# Patient Record
Sex: Female | Born: 1950
Health system: Southern US, Community
[De-identification: ages and names within clinical notes are randomized; demographics above are authoritative.]

## PROBLEM LIST (undated history)

## (undated) DIAGNOSIS — R32 Unspecified urinary incontinence: Secondary | ICD-10-CM

## (undated) DIAGNOSIS — C50919 Malignant neoplasm of unspecified site of unspecified female breast: Secondary | ICD-10-CM

## (undated) DIAGNOSIS — Z923 Personal history of irradiation: Secondary | ICD-10-CM

## (undated) DIAGNOSIS — D649 Anemia, unspecified: Secondary | ICD-10-CM

## (undated) HISTORY — DX: Anemia, unspecified: D64.9

## (undated) HISTORY — DX: Unspecified urinary incontinence: R32

## (undated) HISTORY — DX: Malignant neoplasm of unspecified site of unspecified female breast: C50.919

---

## 1998-08-02 ENCOUNTER — Ambulatory Visit (HOSPITAL_COMMUNITY): Admission: RE | Admit: 1998-08-02 | Discharge: 1998-08-02 | Payer: Self-pay | Admitting: *Deleted

## 1998-08-02 ENCOUNTER — Encounter: Payer: Self-pay | Admitting: *Deleted

## 1999-03-05 ENCOUNTER — Other Ambulatory Visit: Admission: RE | Admit: 1999-03-05 | Discharge: 1999-03-05 | Payer: Self-pay | Admitting: *Deleted

## 1999-09-17 ENCOUNTER — Ambulatory Visit (HOSPITAL_COMMUNITY): Admission: RE | Admit: 1999-09-17 | Discharge: 1999-09-17 | Payer: Self-pay | Admitting: *Deleted

## 1999-09-17 ENCOUNTER — Encounter: Payer: Self-pay | Admitting: *Deleted

## 2000-04-01 ENCOUNTER — Other Ambulatory Visit: Admission: RE | Admit: 2000-04-01 | Discharge: 2000-04-01 | Payer: Self-pay | Admitting: *Deleted

## 2000-09-20 ENCOUNTER — Ambulatory Visit (HOSPITAL_COMMUNITY): Admission: RE | Admit: 2000-09-20 | Discharge: 2000-09-20 | Payer: Self-pay | Admitting: *Deleted

## 2000-09-20 ENCOUNTER — Encounter: Payer: Self-pay | Admitting: *Deleted

## 2001-05-06 ENCOUNTER — Other Ambulatory Visit: Admission: RE | Admit: 2001-05-06 | Discharge: 2001-05-06 | Payer: Self-pay | Admitting: *Deleted

## 2001-11-29 ENCOUNTER — Encounter: Payer: Self-pay | Admitting: *Deleted

## 2001-11-29 ENCOUNTER — Ambulatory Visit (HOSPITAL_COMMUNITY): Admission: RE | Admit: 2001-11-29 | Discharge: 2001-11-29 | Payer: Self-pay | Admitting: *Deleted

## 2002-05-22 ENCOUNTER — Other Ambulatory Visit: Admission: RE | Admit: 2002-05-22 | Discharge: 2002-05-22 | Payer: Self-pay | Admitting: *Deleted

## 2002-12-05 ENCOUNTER — Encounter: Payer: Self-pay | Admitting: *Deleted

## 2002-12-05 ENCOUNTER — Ambulatory Visit (HOSPITAL_COMMUNITY): Admission: RE | Admit: 2002-12-05 | Discharge: 2002-12-05 | Payer: Self-pay | Admitting: *Deleted

## 2003-05-30 ENCOUNTER — Other Ambulatory Visit: Admission: RE | Admit: 2003-05-30 | Discharge: 2003-05-30 | Payer: Self-pay | Admitting: *Deleted

## 2004-06-04 ENCOUNTER — Other Ambulatory Visit: Admission: RE | Admit: 2004-06-04 | Discharge: 2004-06-04 | Payer: Self-pay | Admitting: *Deleted

## 2004-06-18 ENCOUNTER — Encounter: Admission: RE | Admit: 2004-06-18 | Discharge: 2004-06-18 | Payer: Self-pay | Admitting: *Deleted

## 2004-09-05 ENCOUNTER — Ambulatory Visit: Payer: Self-pay | Admitting: Internal Medicine

## 2005-06-17 ENCOUNTER — Other Ambulatory Visit: Admission: RE | Admit: 2005-06-17 | Discharge: 2005-06-17 | Payer: Self-pay | Admitting: *Deleted

## 2005-09-29 ENCOUNTER — Ambulatory Visit (HOSPITAL_COMMUNITY): Admission: RE | Admit: 2005-09-29 | Discharge: 2005-09-29 | Payer: Self-pay | Admitting: Obstetrics and Gynecology

## 2005-10-19 ENCOUNTER — Encounter: Admission: RE | Admit: 2005-10-19 | Discharge: 2005-10-19 | Payer: Self-pay | Admitting: Obstetrics and Gynecology

## 2005-10-19 ENCOUNTER — Encounter: Admission: RE | Admit: 2005-10-19 | Discharge: 2005-10-19 | Payer: Self-pay | Admitting: Diagnostic Radiology

## 2006-04-22 ENCOUNTER — Encounter: Admission: RE | Admit: 2006-04-22 | Discharge: 2006-04-22 | Payer: Self-pay | Admitting: Obstetrics and Gynecology

## 2006-12-03 ENCOUNTER — Other Ambulatory Visit: Admission: RE | Admit: 2006-12-03 | Discharge: 2006-12-03 | Payer: Self-pay | Admitting: Obstetrics & Gynecology

## 2008-03-20 ENCOUNTER — Other Ambulatory Visit: Admission: RE | Admit: 2008-03-20 | Discharge: 2008-03-20 | Payer: Self-pay | Admitting: Obstetrics & Gynecology

## 2008-04-12 ENCOUNTER — Encounter: Admission: RE | Admit: 2008-04-12 | Discharge: 2008-04-12 | Payer: Self-pay | Admitting: Obstetrics & Gynecology

## 2009-01-10 ENCOUNTER — Ambulatory Visit (HOSPITAL_COMMUNITY): Admission: RE | Admit: 2009-01-10 | Discharge: 2009-01-11 | Payer: Self-pay | Admitting: Neurological Surgery

## 2009-01-10 HISTORY — PX: ANTERIOR CERVICAL DECOMP/DISCECTOMY FUSION: SHX1161

## 2009-05-09 ENCOUNTER — Ambulatory Visit (HOSPITAL_COMMUNITY): Admission: RE | Admit: 2009-05-09 | Discharge: 2009-05-09 | Payer: Self-pay | Admitting: Obstetrics & Gynecology

## 2010-05-15 ENCOUNTER — Ambulatory Visit (HOSPITAL_COMMUNITY): Admission: RE | Admit: 2010-05-15 | Discharge: 2010-05-15 | Payer: Self-pay | Admitting: Family Medicine

## 2011-01-14 LAB — CK TOTAL AND CKMB (NOT AT ARMC): Relative Index: 1.4 (ref 0.0–2.5)

## 2011-01-14 LAB — CBC
Hemoglobin: 12.8 g/dL (ref 12.0–15.0)
MCV: 83.2 fL (ref 78.0–100.0)
Platelets: 250 10*3/uL (ref 150–400)
RDW: 12.9 % (ref 11.5–15.5)

## 2011-02-17 NOTE — Op Note (Signed)
[name], [name]               ACCOUNT NO.:  [id number]   MEDICAL RECORD NO.:  [id number]          PATIENT TYPE:  INP   LOCATION:  3526                         FACILITY:  [facility]   PHYSICIAN:  Stefani Dama, M.D.  DATE OF BIRTH:  [birth year]   DATE OF PROCEDURE:  01/10/2009  DATE OF DISCHARGE:                               OPERATIVE REPORT   PREOPERATIVE DIAGNOSIS:  Cervical spondylosis with herniated nucleus  pulposus C4-5, C5-6, and C6-7.   POSTOPERATIVE DIAGNOSIS:  Cervical spondylosis with herniated nucleus  pulposus C4-5, C5-6, and C6-7.   PROCEDURE:  Anterior cervical decompression at C4-5, C5-6, and C6-7,  arthrodesis with structural allograft and Alphatec plate fixation, C4 to  C7.   SURGEON:  Stefani Dama, MD   FIRST ASSISTANT:  Danae Orleans. Venetia Maxon, MD   ANESTHESIA:  General endotracheal.   INDICATIONS:  Amber Santos is a 60 year old individual who has had  significant problems with neck, shoulder, and bilateral arm pain and  mostly left-sided cervical radiculopathy.  She has had symptoms of pain  in the upper extremities on the right side also.  She was treated with  some epidural steroids in the past and also had been using some  significant opioid medication for control her pain.  Despite this, she  was having significant difficulty with ongoing chronic pain.  She has  been advised regarding surgical decompression C4-5, C5-6, and C6-7.   PROCEDURE:  The patient was brought to the operating room and placed on  table in supine position.  After smooth induction of general  endotracheal anesthesia, she had her neck prepped with alcohol and then  DuraPrep.  A transverse incision was created on the left side of neck  and this was brought down through the platysma.  The plane between  sternocleidomastoid and strap muscle was dissected bluntly until the  prevertebral space was reached.  First identifiable disk space on the  radiograph was noted to be that of C4-C5.  A  self-retaining Caspar  retractor was then placed under longus coli muscle at the level of C4-C5  and dissection was continued by opening the anterior longitudinal  ligament with a #15 blade.  A combination of Kerrison rongeurs was used  to evacuate a significant quantity of severely degenerated and  desiccated disk material.  As the region of posterior longitudinal  ligament was reached, there was noted to be bilobar protrusion of the  disk with some uncinate spur formation in the lateral recesses at C4-C5.  This was drilled away carefully and then the redundant thickened  ligament was removed with 2-mm Kerrison punch, first on the right side  and then on the left side.  Once the central and lateral recess  decompression was obtained, hemostasis in the soft tissues was obtained  meticulously.  The interspace was incised for an appropriate size spacer  and it was felt that a 7-mm Alphatec bone spacer would fit best into the  interspace.  It was drilled to the appropriate size and configuration to  fit within the interspace.  Once this was secured, attention was turned  to  C5-6 where a similar process was carried out.  Here, there was much  more central spondylitic stenosis with thickening and enlargement of the  posterior longitudinal ligament and the inferior margin of the disk  itself.  The overgrowth was drilled away and lateral recesses were  drilled away also.  Once hemostasis was established here, a 7-mm round  spacer was placed into the interspace after being shaved to fit in the  appropriate confines.  This was filled with demineralized bone matrix.  At C6-7, a similar procedure was carried out here.  Here again, there  was mostly bilobed protrusion of the disk with significant spondylosis  that was drilled away.  A 7-mm bone spacer was placed into this area  also.  At this point, the area was inspected and a 54-mm standard size  Alphatec plate was fitted to the ventral aspect of  the vertebral bodies.  This was a Trestle plate and it was secured with eight 14-mm variable  angle screws.  Once this was secured, hemostasis in the soft tissues was  obtained meticulously.  The platysma was brought back into the original  position and closed with 3-0 Vicryl interrupted fashion and 3-0 Vicryl  was used to close the subcuticular skin.  The patient tolerated the  procedure well.  Blood loss was estimated about 50 mL.  She was returned  to recovery room in stable condition.      Stefani Dama, M.D.  Electronically Signed     HJE/MEDQ  D:  01/10/2009  T:  01/11/2009  Job:  [id number]

## 2011-05-15 ENCOUNTER — Other Ambulatory Visit: Payer: Self-pay | Admitting: Obstetrics & Gynecology

## 2011-05-15 DIAGNOSIS — Z1231 Encounter for screening mammogram for malignant neoplasm of breast: Secondary | ICD-10-CM

## 2011-05-21 ENCOUNTER — Ambulatory Visit (HOSPITAL_COMMUNITY)
Admission: RE | Admit: 2011-05-21 | Discharge: 2011-05-21 | Disposition: A | Discharge status: Home / Self Care | Payer: BC Managed Care – PPO | Source: Ambulatory Visit | Attending: Obstetrics & Gynecology | Admitting: Obstetrics & Gynecology

## 2011-05-21 DIAGNOSIS — Z1231 Encounter for screening mammogram for malignant neoplasm of breast: Secondary | ICD-10-CM | POA: Insufficient documentation

## 2011-05-26 ENCOUNTER — Other Ambulatory Visit: Payer: Self-pay | Admitting: Obstetrics & Gynecology

## 2011-05-26 DIAGNOSIS — R928 Other abnormal and inconclusive findings on diagnostic imaging of breast: Secondary | ICD-10-CM

## 2011-05-29 ENCOUNTER — Ambulatory Visit
Admission: RE | Admit: 2011-05-29 | Discharge: 2011-05-29 | Disposition: A | Discharge status: Home / Self Care | Payer: BC Managed Care – PPO | Source: Ambulatory Visit | Attending: Obstetrics & Gynecology | Admitting: Obstetrics & Gynecology

## 2011-05-29 DIAGNOSIS — R928 Other abnormal and inconclusive findings on diagnostic imaging of breast: Secondary | ICD-10-CM

## 2011-06-03 ENCOUNTER — Other Ambulatory Visit: Payer: BC Managed Care – PPO

## 2011-10-06 DIAGNOSIS — Z923 Personal history of irradiation: Secondary | ICD-10-CM

## 2011-10-06 DIAGNOSIS — C50919 Malignant neoplasm of unspecified site of unspecified female breast: Secondary | ICD-10-CM

## 2011-10-06 HISTORY — DX: Personal history of irradiation: Z92.3

## 2011-10-06 HISTORY — DX: Malignant neoplasm of unspecified site of unspecified female breast: C50.919

## 2012-04-25 ENCOUNTER — Other Ambulatory Visit (HOSPITAL_COMMUNITY): Payer: Self-pay | Admitting: Diagnostic Radiology

## 2012-04-25 DIAGNOSIS — Z1231 Encounter for screening mammogram for malignant neoplasm of breast: Secondary | ICD-10-CM

## 2012-05-23 ENCOUNTER — Ambulatory Visit (HOSPITAL_COMMUNITY)
Admission: RE | Admit: 2012-05-23 | Discharge: 2012-05-23 | Disposition: A | Discharge status: Home / Self Care | Payer: BC Managed Care – PPO | Source: Ambulatory Visit | Attending: Diagnostic Radiology | Admitting: Diagnostic Radiology

## 2012-05-23 ENCOUNTER — Other Ambulatory Visit: Payer: Self-pay | Admitting: Obstetrics & Gynecology

## 2012-05-23 DIAGNOSIS — Z1231 Encounter for screening mammogram for malignant neoplasm of breast: Secondary | ICD-10-CM | POA: Insufficient documentation

## 2012-05-25 ENCOUNTER — Other Ambulatory Visit: Payer: Self-pay | Admitting: Obstetrics & Gynecology

## 2012-05-25 DIAGNOSIS — R928 Other abnormal and inconclusive findings on diagnostic imaging of breast: Secondary | ICD-10-CM

## 2012-05-27 ENCOUNTER — Ambulatory Visit
Admission: RE | Admit: 2012-05-27 | Discharge: 2012-05-27 | Disposition: A | Discharge status: Home / Self Care | Payer: BC Managed Care – PPO | Source: Ambulatory Visit | Attending: Obstetrics & Gynecology | Admitting: Obstetrics & Gynecology

## 2012-05-27 ENCOUNTER — Other Ambulatory Visit: Payer: Self-pay | Admitting: Obstetrics & Gynecology

## 2012-05-27 DIAGNOSIS — R928 Other abnormal and inconclusive findings on diagnostic imaging of breast: Secondary | ICD-10-CM

## 2012-05-27 DIAGNOSIS — R921 Mammographic calcification found on diagnostic imaging of breast: Secondary | ICD-10-CM

## 2012-06-01 ENCOUNTER — Ambulatory Visit
Admission: RE | Admit: 2012-06-01 | Discharge: 2012-06-01 | Disposition: A | Discharge status: Home / Self Care | Payer: BC Managed Care – PPO | Source: Ambulatory Visit | Attending: Obstetrics & Gynecology | Admitting: Obstetrics & Gynecology

## 2012-06-01 DIAGNOSIS — R921 Mammographic calcification found on diagnostic imaging of breast: Secondary | ICD-10-CM

## 2012-06-16 ENCOUNTER — Ambulatory Visit (INDEPENDENT_AMBULATORY_CARE_PROVIDER_SITE_OTHER): Payer: BC Managed Care – PPO | Admitting: General Surgery

## 2012-06-16 ENCOUNTER — Other Ambulatory Visit (INDEPENDENT_AMBULATORY_CARE_PROVIDER_SITE_OTHER): Payer: Self-pay | Admitting: General Surgery

## 2012-06-16 ENCOUNTER — Encounter (HOSPITAL_BASED_OUTPATIENT_CLINIC_OR_DEPARTMENT_OTHER): Payer: Self-pay | Admitting: *Deleted

## 2012-06-16 ENCOUNTER — Encounter (INDEPENDENT_AMBULATORY_CARE_PROVIDER_SITE_OTHER): Payer: Self-pay | Admitting: General Surgery

## 2012-06-16 ENCOUNTER — Telehealth (INDEPENDENT_AMBULATORY_CARE_PROVIDER_SITE_OTHER): Payer: Self-pay | Admitting: General Surgery

## 2012-06-16 VITALS — BP 130/88 | HR 74 | Temp 97.3°F | Resp 16 | Ht 68.0 in | Wt 200.1 lb

## 2012-06-16 DIAGNOSIS — N6099 Unspecified benign mammary dysplasia of unspecified breast: Secondary | ICD-10-CM

## 2012-06-16 DIAGNOSIS — N6089 Other benign mammary dysplasias of unspecified breast: Secondary | ICD-10-CM

## 2012-06-16 NOTE — H&P (Signed)
Amber Santos    MRN: [id number]   Description: 61 year old female  Provider: [name], MD  Department: [facility]       Diagnoses     Atypical ductal hyperplasia, breast   - Primary    610.8         Vitals -   BP Pulse Temp Resp Ht Wt    130/88 74 97.3 F (36.3 C) (Temporal) 16 5\' 8"  (1.727 m) 200 lb 2 oz (90.776 kg)     BMI - 30.43 kg/m2                 History and Physical     [name], MD  Patient ID: Amber Santos, female   DOB: [birth year], 61 y.o.   MRN: [id number]                HPI Amber Santos is a 61 y.o. female.  She is referred by Dr. Cain Saupe for evaluation and management of a focal area of atypical ductal hyperplasia of the left breast, upper outer quadrant. Her primary care physician is Dr. Annamaria Boots.   This patient has a family history for breast cancer. She is a member of the [employer]. She has never had a breast problem before.   Recent screening mammogram showed a focal area of microcalcifications in the upper outer quadrant of the left breast posteriorly. Image guided biopsy of this area shows atypical ductal hyperplasia. Dr. Adolphus Birchwood comments that this may turn out to be a low-grade DCIS. The patient has been given a copy of her pathology report.   Family history is significant for breast cancer in her mother, maternal aunt, and paternal first cousin. One aunt had pancreatic cancer. Other members of the family had colon cancer. She is otherwise healthy he has had a C-section and cervical disc surgery. 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      Past Medical History   Diagnosis  Date   .  Anemia         Past Surgical History   Procedure  Date   .  Cesarean section  1986, 1987   .  Neck surgery  2010       Family History   Problem  Relation  Age of Onset   .  Cancer  Mother         breast   .  Cancer  Maternal Aunt         breast, pancreatic     .  Cancer  Maternal Uncle         colon      Social History History   Substance Use Topics   .  Smoking status:  Former Smoker       Quit date:  06/17/1984   .  Smokeless tobacco:  Never Used   .  Alcohol Use:  Yes         weekends, events      No Known Allergies    No current outpatient prescriptions on file.      Review of Systems  Constitutional: Negative for fever, chills and unexpected weight change.  HENT: Negative for hearing loss, congestion, sore throat, trouble swallowing and voice change.   Eyes: Negative for visual disturbance.  Respiratory: Negative for cough and wheezing.   Cardiovascular: Negative for chest pain, palpitations and leg swelling.  Gastrointestinal: Negative for nausea, vomiting, abdominal pain, diarrhea,  constipation, blood in stool, abdominal distention and anal bleeding.  Genitourinary: Negative for hematuria, vaginal bleeding and difficulty urinating.  Musculoskeletal: Negative for arthralgias.  Skin: Negative for rash and wound.  Neurological: Negative for seizures, syncope and headaches.  Hematological: Negative for adenopathy. Does not bruise/bleed easily.  Psychiatric/Behavioral: Negative for confusion.    Blood pressure 130/88, pulse 74, temperature 97.3 F (36.3 C), temperature source Temporal, resp. rate 16, height 5\' 8"  (1.727 m), weight 200 lb 2 oz (90.776 kg).   Physical Exam   Constitutional: She is oriented to person, place, and time. She appears well-developed and well-nourished. No distress.  HENT:   Head: Normocephalic and atraumatic.   Nose: Nose normal.   Mouth/Throat: No oropharyngeal exudate.  Eyes: Conjunctivae normal and EOM are normal. Pupils are equal, round, and reactive to light. Left eye exhibits no discharge. No scleral icterus.  Neck: Neck supple. No JVD present. No tracheal deviation present. No thyromegaly present.  Cardiovascular: Normal rate, regular rhythm, normal heart sounds and intact distal  pulses.    No murmur heard. Pulmonary/Chest: Effort normal and breath sounds normal. No respiratory distress. She has no wheezes. She has no rales. She exhibits no tenderness.       Breasts are large. No palpable mass bilaterally. Biopsy site left breast upper outer quadrant. No ecchymoses. No hematoma. No adenopathy.  Abdominal: Soft. Bowel sounds are normal. She exhibits no distension and no mass. There is no tenderness. There is no rebound and no guarding.  Musculoskeletal: She exhibits no edema and no tenderness.  Lymphadenopathy:    She has no cervical adenopathy.  Neurological: She is alert and oriented to person, place, and time. She exhibits normal muscle tone. Coordination normal.  Skin: Skin is warm. No rash noted. She is not diaphoretic. No erythema. No pallor.  Psychiatric: She has a normal mood and affect. Her behavior is normal. Judgment and thought content normal.    Data Reviewed Mammograms, ultrasound, pathology report.   Assessment Atypical ductal hyperplasia left breast, upper outer quadrant. Small focal area. Excision is indicated to exclude occult noninvasive cancer   Plan Will schedule for left partial mastectomy with margins and needle localization in the near future.   I explained to her and her husband I would like to take a small margin because of the possibility of DCIS in hopes of avoiding a second operation.   We talked about referral to high risk medical oncologist to discuss strategies for risk reduction  assuming this turns out to be ADH. We talked about the need for further treatment if this turns out to be DCIS.   We discussed the indications, details, techniques, and numerous risks of the surgery. We talked about a second operation if there is a positive margin and how we control for that. She understands these issues. Her questions were answered. She agrees with this plan.     [name]. [name], M.D., [facility] General and Minimally invasive Surgery Breast and Colorectal Surgery Office:   [phone] Pager:   [phone]

## 2012-06-16 NOTE — Telephone Encounter (Signed)
Called and left message for patient on cell and home number advising of appointment set for post op on 06/29/12 at 5:15. Patient requested evening/late appointment. Advised for patient to return the call to confirm message was received.

## 2012-06-16 NOTE — Patient Instructions (Signed)
Your recent image guided biopsy of the left breast, upper outer quadrant shows atypical ductal hyperplasia. This area should be excised completely with a margin of normal tissue to be sure there is not early breast cancer newly scheduled for that surgery in the near future.    Lumpectomy, Breast Conserving Surgery A lumpectomy is breast surgery that removes only part of the breast. Another name used may be partial mastectomy. The amount removed varies. Make sure you understand how much of your breast will be removed. Reasons for a lumpectomy:  Any solid breast mass.   Grouped significant nodularity that may be confused with a solitary breast mass.  Lumpectomy is the most common form of breast cancer surgery today. The surgeon removes the portion of your breast which contains the tumor (cancer). This is the lump. Some normal tissue around the lump is also removed to be sure that all the tumor has been removed.  If cancer cells are found in the margins where the breast tissue was removed, your surgeon will do more surgery to remove the remaining cancer tissue. This is called re-excision surgery. Radiation and/or chemotherapy treatments are often given following a lumpectomy to kill any cancer cells that could possibly remain.  REASONS YOU MAY NOT BE ABLE TO HAVE BREAST CONSERVING SURGERY:  The tumor is located in more than one place.   Your breast is small and the tumor is large so the breast would be disfigured.   The entire tumor removal is not successful with a lumpectomy.   You cannot commit to a full course of chemotherapy, radiation therapy or are pregnant and cannot have radiation.   You have previously had radiation to the breast to treat cancer.  HOW A LUMPECTOMY IS PERFORMED If overnight nursing is not required following a biopsy, a lumpectomy can be performed as a same-day surgery. This can be done in a hospital, clinic, or surgical center. The anesthesia used will depend on your  surgeon. They will discuss this with you. A general anesthetic keeps you sleeping through the procedure. LET YOUR CAREGIVERS KNOW ABOUT THE FOLLOWING:  Allergies   Medications taken including herbs, eye drops, over the counter medications, and creams.   Use of steroids (by mouth or creams)   Previous problems with anesthetics or Novocaine.   Possibility of pregnancy, if this applies   History of blood clots (thrombophlebitis)   History of bleeding or blood problems.   Previous surgery   Other health problems  BEFORE THE PROCEDURE You should be present one hour prior to your procedure unless directed otherwise.  AFTER THE PROCEDURE  After surgery, you will be taken to the recovery area where a nurse will watch and check your progress. Once you're awake, stable, and taking fluids well, barring other problems you will be allowed to go home.   Ice packs applied to your operative site may help with discomfort and keep the swelling down.   A small rubber drain may be placed in the breast for a couple of days to prevent a hematoma from developing in the breast.   A pressure dressing may be applied for 24 to 48 hours to prevent bleeding.   Keep the wound dry.   You may resume a normal diet and activities as directed. Avoid strenuous activities affecting the arm on the side of the biopsy site such as tennis, swimming, heavy lifting (more than 10 pounds) or pulling.   Bruising in the breast is normal following this procedure.  Wearing a bra - even to bed - may be more comfortable and also help keep the dressing on.   Change dressings as directed.   Only take over-the-counter or prescription medicines for pain, discomfort, or fever as directed by your caregiver.  Call for your results as instructed by your surgeon. Remember it is your responsibility to get the results of your lumpectomy if your surgeon asked you to follow-up. Do not assume everything is fine if you have not heard  from your caregiver. SEEK MEDICAL CARE IF:   There is increased bleeding (more than a small spot) from the wound.   You notice redness, swelling, or increasing pain in the wound.   Pus is coming from wound.   An unexplained oral temperature above 102 F (38.9 C) develops.   You notice a foul smell coming from the wound or dressing.  SEEK IMMEDIATE MEDICAL CARE IF:   You develop a rash.   You have difficulty breathing.   You have any allergic problems.  Document Released: 11/02/2006 Document Revised: 09/10/2011 Document Reviewed: 02/03/2007 [facility] Patient Information 2012 [place], [place].

## 2012-06-16 NOTE — Progress Notes (Signed)
Patient ID: Amber Santos, female   DOB: 17-Dec-1950, 61 y.o.   MRN: [id number]  Chief Complaint  Patient presents with  . New Evaluation    Ductal hyperplasia     HPI Amber Santos is a 61 y.o. female.  She is referred by Dr. Cain Saupe for evaluation and management of a focal area of atypical ductal hyperplasia of the left breast, upper outer quadrant. Her primary care physician is Dr. Annamaria Boots.  This patient has a family history for breast cancer. She is a member of the [employer]. She has never had a breast problem before.  Recent screening mammogram showed a focal area of microcalcifications in the upper outer quadrant of the left breast posteriorly. Image guided biopsy of this area shows atypical ductal hyperplasia. Dr. Adolphus Birchwood comments that this may turn out to be a low-grade DCIS. The patient has been given a copy of her pathology report.  Family history is significant for breast cancer in her mother, maternal aunt, and paternal first cousin. One aunt had pancreatic cancer. Other members of the family had colon cancer. She is otherwise healthy he has had a C-section and cervical disc surgery. 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 HPI  Past Medical History  Diagnosis Date  . Anemia     Past Surgical History  Procedure Date  . Cesarean section 1986, 1987  . Neck surgery 2010    Family History  Problem Relation Age of Onset  . Cancer Mother     breast  . Cancer Maternal Aunt     breast, pancreatic  . Cancer Maternal Uncle     colon    Social History History  Substance Use Topics  . Smoking status: Former Smoker    Quit date: 06/17/1984  . Smokeless tobacco: Never Used  . Alcohol Use: Yes     weekends, events    No Known Allergies  No current outpatient prescriptions on file.    Review of Systems Review of Systems  Constitutional: Negative for fever, chills and unexpected weight change.  HENT:  Negative for hearing loss, congestion, sore throat, trouble swallowing and voice change.   Eyes: Negative for visual disturbance.  Respiratory: Negative for cough and wheezing.   Cardiovascular: Negative for chest pain, palpitations and leg swelling.  Gastrointestinal: Negative for nausea, vomiting, abdominal pain, diarrhea, constipation, blood in stool, abdominal distention and anal bleeding.  Genitourinary: Negative for hematuria, vaginal bleeding and difficulty urinating.  Musculoskeletal: Negative for arthralgias.  Skin: Negative for rash and wound.  Neurological: Negative for seizures, syncope and headaches.  Hematological: Negative for adenopathy. Does not bruise/bleed easily.  Psychiatric/Behavioral: Negative for confusion.    Blood pressure 130/88, pulse 74, temperature 97.3 F (36.3 C), temperature source Temporal, resp. rate 16, height 5\' 8"  (1.727 m), weight 200 lb 2 oz (90.776 kg).  Physical Exam Physical Exam  Constitutional: She is oriented to person, place, and time. She appears well-developed and well-nourished. No distress.  HENT:  Head: Normocephalic and atraumatic.  Nose: Nose normal.  Mouth/Throat: No oropharyngeal exudate.  Eyes: Conjunctivae normal and EOM are normal. Pupils are equal, round, and reactive to light. Left eye exhibits no discharge. No scleral icterus.  Neck: Neck supple. No JVD present. No tracheal deviation present. No thyromegaly present.  Cardiovascular: Normal rate, regular rhythm, normal heart sounds and intact distal pulses.   No murmur heard. Pulmonary/Chest: Effort normal and breath sounds normal. No respiratory distress. She has no  wheezes. She has no rales. She exhibits no tenderness.       Breasts are large. No palpable mass bilaterally. Biopsy site left breast upper outer quadrant. No ecchymoses. No hematoma. No adenopathy.  Abdominal: Soft. Bowel sounds are normal. She exhibits no distension and no mass. There is no tenderness. There is  no rebound and no guarding.  Musculoskeletal: She exhibits no edema and no tenderness.  Lymphadenopathy:    She has no cervical adenopathy.  Neurological: She is alert and oriented to person, place, and time. She exhibits normal muscle tone. Coordination normal.  Skin: Skin is warm. No rash noted. She is not diaphoretic. No erythema. No pallor.  Psychiatric: She has a normal mood and affect. Her behavior is normal. Judgment and thought content normal.    Data Reviewed Mammograms, ultrasound, pathology report.  Assessment    Atypical ductal hyperplasia left breast, upper outer quadrant. Small focal area. Excision is indicated to exclude occult noninvasive cancer    Plan    Will schedule for left partial mastectomy with margins and needle localization in the near future.  I explained to her and her husband I would like to take a small margin because of the possibility of DCIS in hopes of avoiding a second operation.  We talked about referral to high risk medical oncologist to discuss strategies for risk reduction  assuming this turns out to be ADH. We talked about the need for further treatment if this turns out to be DCIS.  We discussed the indications, details, techniques, and numerous risks of the surgery. We talked about a second operation if there is a positive margin and how we control for that. She understands these issues. Her questions were answered. She agrees with this plan.       Aiden Helzer M 06/16/2012, 9:16 AM

## 2012-06-16 NOTE — Progress Notes (Signed)
Pt here from [facility] office-she is nervous getting this done so soon, but ready. Mother br ca survivor. No labs needed

## 2012-06-17 ENCOUNTER — Encounter (HOSPITAL_BASED_OUTPATIENT_CLINIC_OR_DEPARTMENT_OTHER): Payer: Self-pay | Admitting: Anesthesiology

## 2012-06-17 ENCOUNTER — Ambulatory Visit (HOSPITAL_BASED_OUTPATIENT_CLINIC_OR_DEPARTMENT_OTHER): Payer: BC Managed Care – PPO | Admitting: Anesthesiology

## 2012-06-17 ENCOUNTER — Encounter (HOSPITAL_BASED_OUTPATIENT_CLINIC_OR_DEPARTMENT_OTHER)
Admission: RE | Disposition: A | Discharge status: Home / Self Care | Payer: Self-pay | Source: Ambulatory Visit | Attending: General Surgery

## 2012-06-17 ENCOUNTER — Ambulatory Visit
Admission: RE | Admit: 2012-06-17 | Discharge: 2012-06-17 | Disposition: A | Discharge status: Home / Self Care | Payer: BC Managed Care – PPO | Source: Ambulatory Visit | Attending: General Surgery | Admitting: General Surgery

## 2012-06-17 ENCOUNTER — Ambulatory Visit (HOSPITAL_BASED_OUTPATIENT_CLINIC_OR_DEPARTMENT_OTHER)
Admission: RE | Admit: 2012-06-17 | Discharge: 2012-06-17 | Disposition: A | Discharge status: Home / Self Care | Payer: BC Managed Care – PPO | Source: Ambulatory Visit | Attending: General Surgery | Admitting: General Surgery

## 2012-06-17 ENCOUNTER — Encounter (HOSPITAL_BASED_OUTPATIENT_CLINIC_OR_DEPARTMENT_OTHER): Payer: Self-pay | Admitting: *Deleted

## 2012-06-17 DIAGNOSIS — D0590 Unspecified type of carcinoma in situ of unspecified breast: Secondary | ICD-10-CM | POA: Insufficient documentation

## 2012-06-17 DIAGNOSIS — N6099 Unspecified benign mammary dysplasia of unspecified breast: Secondary | ICD-10-CM

## 2012-06-17 DIAGNOSIS — N6089 Other benign mammary dysplasias of unspecified breast: Secondary | ICD-10-CM | POA: Insufficient documentation

## 2012-06-17 DIAGNOSIS — C50419 Malignant neoplasm of upper-outer quadrant of unspecified female breast: Secondary | ICD-10-CM | POA: Insufficient documentation

## 2012-06-17 DIAGNOSIS — Z803 Family history of malignant neoplasm of breast: Secondary | ICD-10-CM | POA: Insufficient documentation

## 2012-06-17 HISTORY — PX: BREAST LUMPECTOMY: SHX2

## 2012-06-17 SURGERY — PARTIAL MASTECTOMY WITH NEEDLE LOCALIZATION
Anesthesia: General | Site: Breast | Laterality: Left | Wound class: Clean

## 2012-06-17 MED ORDER — HYDROCODONE-ACETAMINOPHEN 5-325 MG PO TABS: 1.0000 | ORAL_TABLET | ORAL | Status: AC | PRN

## 2012-06-17 MED ORDER — ACETAMINOPHEN 10 MG/ML IV SOLN
1000.0000 mg | Freq: Once | INTRAVENOUS | Status: AC
  Administered 2012-06-17: 1000 mg via INTRAVENOUS

## 2012-06-17 MED ORDER — PROPOFOL 10 MG/ML IV BOLUS
INTRAVENOUS | Status: DC | PRN
  Administered 2012-06-17: 200 mg via INTRAVENOUS

## 2012-06-17 MED ORDER — HEPARIN SODIUM (PORCINE) 5000 UNIT/ML IJ SOLN
5000.0000 [IU] | Freq: Once | INTRAMUSCULAR | Status: AC
  Administered 2012-06-17: 5000 [IU] via SUBCUTANEOUS

## 2012-06-17 MED ORDER — FENTANYL CITRATE 0.05 MG/ML IJ SOLN
INTRAMUSCULAR | Status: DC | PRN
  Administered 2012-06-17 (×2): 25 ug via INTRAVENOUS
  Administered 2012-06-17: 50 ug via INTRAVENOUS
  Administered 2012-06-17: 25 ug via INTRAVENOUS

## 2012-06-17 MED ORDER — MIDAZOLAM HCL 5 MG/5ML IJ SOLN: INTRAMUSCULAR | Status: DC | PRN

## 2012-06-17 MED ORDER — EPHEDRINE SULFATE 50 MG/ML IJ SOLN
INTRAMUSCULAR | Status: DC | PRN
  Administered 2012-06-17: 15 mg via INTRAVENOUS

## 2012-06-17 MED ORDER — CHLORHEXIDINE GLUCONATE 4 % EX LIQD: 1.0000 "application " | Freq: Once | CUTANEOUS | Status: DC

## 2012-06-17 MED ORDER — LACTATED RINGERS IV SOLN
INTRAVENOUS | Status: DC
  Administered 2012-06-17: 14:00:00 via INTRAVENOUS

## 2012-06-17 MED ORDER — BUPIVACAINE-EPINEPHRINE 0.5% -1:200000 IJ SOLN
INTRAMUSCULAR | Status: DC | PRN
  Administered 2012-06-17: 10 mL

## 2012-06-17 MED ORDER — MIDAZOLAM HCL 5 MG/5ML IJ SOLN
INTRAMUSCULAR | Status: DC | PRN
  Administered 2012-06-17: 2 mg via INTRAVENOUS
  Administered 2012-06-17: 0.5 mg via INTRAVENOUS

## 2012-06-17 MED ORDER — OXYCODONE HCL 5 MG PO TABS
5.0000 mg | ORAL_TABLET | Freq: Once | ORAL | Status: AC | PRN
  Administered 2012-06-17: 5 mg via ORAL

## 2012-06-17 MED ORDER — CEFAZOLIN SODIUM-DEXTROSE 2-3 GM-% IV SOLR
2.0000 g | INTRAVENOUS | Status: AC
  Administered 2012-06-17: 2 g via INTRAVENOUS

## 2012-06-17 MED ORDER — ONDANSETRON HCL 4 MG/2ML IJ SOLN
INTRAMUSCULAR | Status: DC | PRN
  Administered 2012-06-17: 4 mg via INTRAVENOUS

## 2012-06-17 MED ORDER — SCOPOLAMINE 1 MG/3DAYS TD PT72
1.0000 | MEDICATED_PATCH | Freq: Once | TRANSDERMAL | Status: DC
  Administered 2012-06-17: 1.5 mg via TRANSDERMAL

## 2012-06-17 MED ORDER — LIDOCAINE HCL (CARDIAC) 20 MG/ML IV SOLN
INTRAVENOUS | Status: DC | PRN
  Administered 2012-06-17: 50 mg via INTRAVENOUS

## 2012-06-17 MED ORDER — OXYCODONE HCL 5 MG/5ML PO SOLN: 5.0000 mg | Freq: Once | ORAL | Status: AC | PRN

## 2012-06-17 MED ORDER — HYDROMORPHONE HCL PF 1 MG/ML IJ SOLN
0.2500 mg | INTRAMUSCULAR | Status: DC | PRN
  Administered 2012-06-17 (×2): 0.5 mg via INTRAVENOUS

## 2012-06-17 MED ORDER — DEXAMETHASONE SODIUM PHOSPHATE 4 MG/ML IJ SOLN
INTRAMUSCULAR | Status: DC | PRN
  Administered 2012-06-17: 10 mg via INTRAVENOUS

## 2012-06-17 SURGICAL SUPPLY — 59 items
ADH SKN CLS APL DERMABOND .7 (GAUZE/BANDAGES/DRESSINGS) ×2
APL SKNCLS STERI-STRIP NONHPOA (GAUZE/BANDAGES/DRESSINGS)
APPLIER CLIP 9.375 MED OPEN (MISCELLANEOUS)
APR CLP MED 9.3 20 MLT OPN (MISCELLANEOUS)
BANDAGE ELASTIC 6 VELCRO ST LF (GAUZE/BANDAGES/DRESSINGS) IMPLANT
BENZOIN TINCTURE PRP APPL 2/3 (GAUZE/BANDAGES/DRESSINGS) IMPLANT
BINDER BREAST XLRG (GAUZE/BANDAGES/DRESSINGS) ×1 IMPLANT
BLADE HEX COATED 2.75 (ELECTRODE) ×2 IMPLANT
BLADE SURG 15 STRL LF DISP TIS (BLADE) ×2 IMPLANT
BLADE SURG 15 STRL SS (BLADE) ×2
CANISTER SUCTION 1200CC (MISCELLANEOUS) ×2 IMPLANT
CHLORAPREP W/TINT 26ML (MISCELLANEOUS) ×2 IMPLANT
CLIP APPLIE 9.375 MED OPEN (MISCELLANEOUS) IMPLANT
CLOTH BEACON ORANGE TIMEOUT ST (SAFETY) ×2 IMPLANT
COVER MAYO STAND STRL (DRAPES) ×2 IMPLANT
COVER TABLE BACK 60X90 (DRAPES) ×2 IMPLANT
DECANTER SPIKE VIAL GLASS SM (MISCELLANEOUS) IMPLANT
DERMABOND ADVANCED (GAUZE/BANDAGES/DRESSINGS) ×2
DERMABOND ADVANCED .7 DNX12 (GAUZE/BANDAGES/DRESSINGS) IMPLANT
DEVICE DUBIN W/COMP PLATE 8390 (MISCELLANEOUS) ×1 IMPLANT
DRAPE LAPAROSCOPIC ABDOMINAL (DRAPES) IMPLANT
DRAPE LAPAROTOMY TRNSV 102X78 (DRAPE) IMPLANT
DRAPE PED LAPAROTOMY (DRAPES) ×2 IMPLANT
DRAPE UTILITY XL STRL (DRAPES) ×2 IMPLANT
ELECT REM PT RETURN 9FT ADLT (ELECTROSURGICAL) ×2
ELECTRODE REM PT RTRN 9FT ADLT (ELECTROSURGICAL) ×1 IMPLANT
GAUZE SPONGE 4X4 12PLY STRL LF (GAUZE/BANDAGES/DRESSINGS) IMPLANT
GAUZE SPONGE 4X4 16PLY XRAY LF (GAUZE/BANDAGES/DRESSINGS) IMPLANT
GLOVE ECLIPSE 6.5 STRL STRAW (GLOVE) ×1 IMPLANT
GLOVE EUDERMIC 7 POWDERFREE (GLOVE) ×2 IMPLANT
GOWN PREVENTION PLUS XLARGE (GOWN DISPOSABLE) ×2 IMPLANT
GOWN PREVENTION PLUS XXLARGE (GOWN DISPOSABLE) ×2 IMPLANT
KIT MARKER MARGIN INK (KITS) ×1 IMPLANT
NDL HYPO 25X1 1.5 SAFETY (NEEDLE) ×1 IMPLANT
NEEDLE HYPO 22GX1.5 SAFETY (NEEDLE) IMPLANT
NEEDLE HYPO 25X1 1.5 SAFETY (NEEDLE) ×2 IMPLANT
NS IRRIG 1000ML POUR BTL (IV SOLUTION) ×2 IMPLANT
PACK BASIN DAY SURGERY FS (CUSTOM PROCEDURE TRAY) ×2 IMPLANT
PENCIL BUTTON HOLSTER BLD 10FT (ELECTRODE) ×2 IMPLANT
SLEEVE SCD COMPRESS KNEE MED (MISCELLANEOUS) ×1 IMPLANT
SPONGE LAP 4X18 X RAY DECT (DISPOSABLE) ×2 IMPLANT
STRIP CLOSURE SKIN 1/2X4 (GAUZE/BANDAGES/DRESSINGS) IMPLANT
SUT ETHILON 4 0 PS 2 18 (SUTURE) IMPLANT
SUT MON AB 4-0 PC3 18 (SUTURE) ×1 IMPLANT
SUT SILK 2 0 SH (SUTURE) ×2 IMPLANT
SUT VIC AB 2-0 SH 27 (SUTURE)
SUT VIC AB 2-0 SH 27XBRD (SUTURE) IMPLANT
SUT VIC AB 3-0 FS2 27 (SUTURE) IMPLANT
SUT VIC AB 4-0 P-3 18XBRD (SUTURE) IMPLANT
SUT VIC AB 4-0 P3 18 (SUTURE)
SUT VICRYL 3-0 CR8 SH (SUTURE) ×2 IMPLANT
SUT VICRYL 4-0 PS2 18IN ABS (SUTURE) IMPLANT
SYR BULB 3OZ (MISCELLANEOUS) IMPLANT
SYR CONTROL 10ML LL (SYRINGE) ×2 IMPLANT
TAPE HYPAFIX 4 X10 (GAUZE/BANDAGES/DRESSINGS) IMPLANT
TOWEL OR NON WOVEN STRL DISP B (DISPOSABLE) ×2 IMPLANT
TUBE CONNECTING 20X1/4 (TUBING) ×2 IMPLANT
WATER STERILE IRR 1000ML POUR (IV SOLUTION) ×2 IMPLANT
YANKAUER SUCT BULB TIP NO VENT (SUCTIONS) ×2 IMPLANT

## 2012-06-17 NOTE — Anesthesia Preprocedure Evaluation (Addendum)
Anesthesia Evaluation  Patient identified by MRN, date of birth, ID band Patient awake    Reviewed: Allergy & Precautions, H&P , NPO status , Patient's Chart, lab work & pertinent test results  Airway Mallampati: II TM Distance: >3 FB Neck ROM: Full    Dental No notable dental hx. (+) Teeth Intact and Dental Advisory Given   Pulmonary neg pulmonary ROS,  breath sounds clear to auscultation  Pulmonary exam normal       Cardiovascular negative cardio ROS  Rhythm:Regular Rate:Normal     Neuro/Psych negative neurological ROS  negative psych ROS   GI/Hepatic negative GI ROS, Neg liver ROS,   Endo/Other  negative endocrine ROS  Renal/GU negative Renal ROS  negative genitourinary   Musculoskeletal   Abdominal   Peds  Hematology negative hematology ROS (+)   Anesthesia Other Findings   Reproductive/Obstetrics negative OB ROS                           Anesthesia Physical Anesthesia Plan  ASA: II  Anesthesia Plan: General   Post-op Pain Management:    Induction: Intravenous  Airway Management Planned: LMA  Additional Equipment:   Intra-op Plan:   Post-operative Plan: Extubation in OR  Informed Consent: I have reviewed the patients History and Physical, chart, labs and discussed the procedure including the risks, benefits and alternatives for the proposed anesthesia with the patient or authorized representative who has indicated his/her understanding and acceptance.   Dental advisory given  Plan Discussed with: CRNA  Anesthesia Plan Comments:         Anesthesia Quick Evaluation  

## 2012-06-17 NOTE — Transfer of Care (Signed)
Immediate Anesthesia Transfer of Care Note  Patient: Amber Santos  Procedure(s) Performed: Procedure(s) (LRB) with comments: PARTIAL MASTECTOMY WITH NEEDLE LOCALIZATION (Left) - left partial mastectomy with needle localization  Patient Location: PACU  Anesthesia Type: General  Level of Consciousness: awake  Airway & Oxygen Therapy: Patient Spontanous Breathing and Patient connected to face mask oxygen  Post-op Assessment: Report given to PACU RN and Post -op Vital signs reviewed and stable  Post vital signs: Reviewed and stable  Complications: No apparent anesthesia complications

## 2012-06-17 NOTE — Interval H&P Note (Signed)
History and Physical Interval Note:  06/17/2012 2:13 PM  Amber Santos  has presented today for surgery, with the diagnosis of atypical ductal hyperplasia  The goals and the  various methods of treatment have been discussed with the patient and family. After consideration of risks, benefits and other options for treatment, the patient has consented to  Procedure(s) (LRB) with comments: PARTIAL MASTECTOMY WITH NEEDLE LOCALIZATION (Left) - left partial mastectomy with needle localization as a surgical intervention .  The patient's history has been reviewed, patient examined, no change in status, stable for surgery.  I have reviewed the patient's chart and labs.  Questions were answered to the patient's satisfaction.     [name]

## 2012-06-17 NOTE — Anesthesia Procedure Notes (Signed)
Procedure Name: LMA Insertion Performed by: York Grice Pre-anesthesia Checklist: Patient identified, Timeout performed, Emergency Drugs available, Suction available and Patient being monitored Patient Re-evaluated:Patient Re-evaluated prior to inductionOxygen Delivery Method: Circle system utilized Preoxygenation: Pre-oxygenation with 100% oxygen Intubation Type: IV induction Ventilation: Mask ventilation without difficulty LMA: LMA with gastric port inserted LMA Size: 4.0 Number of attempts: 2 Placement Confirmation: breath sounds checked- equal and bilateral Tube secured with: Tape Dental Injury: Teeth and Oropharynx as per pre-operative assessment

## 2012-06-17 NOTE — Anesthesia Postprocedure Evaluation (Signed)
  Anesthesia Post-op Note  Patient: Amber Santos  Procedure(s) Performed: Procedure(s) (LRB) with comments: PARTIAL MASTECTOMY WITH NEEDLE LOCALIZATION (Left) - left partial mastectomy with needle localization  Patient Location: PACU  Anesthesia Type: General  Level of Consciousness: awake and alert   Airway and Oxygen Therapy: Patient Spontanous Breathing  Post-op Pain: mild  Post-op Assessment: Post-op Vital signs reviewed, Patient's Cardiovascular Status Stable, Respiratory Function Stable, Patent Airway and No signs of Nausea or vomiting  Post-op Vital Signs: Reviewed and stable  Complications: No apparent anesthesia complications

## 2012-06-17 NOTE — Op Note (Signed)
Patient Name:           Amber Santos   Date of Surgery:        06/17/2012  Pre op Diagnosis:      Atypical ductal hyperplasia, left breast, upper outer quadrant  Post op Diagnosis:    same  Procedure:                 Left partial mastectomy with needle localization  Surgeon:                     [name]. [name], M.D., FACS  Assistant:                      none  Operative Indications:   GENEVER HENTGES is a 61 y.o. female. She was referred by Dr. Cain Saupe for evaluation and management of a focal area of atypical ductal hyperplasia of the left breast, upper outer quadrant. Her primary care physician is Dr. Annamaria Boots. . She has never had a breast problem before.  Recent screening mammogram showed a focal area of microcalcifications in the upper outer quadrant of the left breast posteriorly. Image guided biopsy of this area shows atypical ductal hyperplasia. Dr. Adolphus Birchwood comments that this may turn out to be a low-grade DCIS. The patient has been given a copy of her pathology report.  Family history is significant for breast cancer in her mother, maternal aunt, and paternal first cousin. One aunt had pancreatic cancer. Other members of the family had colon cancer. She is otherwise healthy he has had a C-section and cervical disc surgery. She is a Production designer, theatre/television/film. She is brought to the operating room electively, following counseling in my office.   Operative Findings:       The left breast tissue showed some biopsy changes, and the specimen mammogram showed that the wire and a marker clip were completely contained within the specimen.  Procedure in Detail:          The patient underwent wire localization at the [facility] [place], and the wire was well placed from lateral to medial. The patient was brought to [facility] center and general anesthesia was induced. The left breast was prepped and draped in a sterile fashion. Intravenous antibiotics were given.  Surgical time out was performed. 0.5% Marcaine with epinephrine was used as a local infiltration anesthetic. A curvilinear incision was made laterally in the left breast, paralleling the areolar margin although this was 12-15 cm lateral to the areolar margin. Dissection was carried down into the breast tissue and widely around the localizing wire. We never saw the wire in the deeper  portions of the specimen. The specimen was removed and marked with sutures and with six color ink kit. Specimen mammogram was performed, and it looked good.. Dr. Jean Rosenthal stated that the wire and the clip were within the specimen. The specimen was sent to the pathology lab. Hemostasis was excellent and achieved with electrocautery. The wound irrigated with saline. The breast tissues were closed in several layers with interrupted sutures of 3-0 Vicryl and the skin closed with a running subcuticular suture of 4-0 Monocryl and Dermabond. The patient taken to recovery in stable condition. Estimated blood loss 15 cc. Counts correct. Complications none.     [name]. [name], M.D., FACS General and Minimally Invasive Surgery Breast and Colorectal Surgery  06/17/2012 3:17 PM

## 2012-06-20 LAB — POCT HEMOGLOBIN-HEMACUE: Hemoglobin: 13.2 g/dL (ref 12.0–15.0)

## 2012-06-21 ENCOUNTER — Telehealth (INDEPENDENT_AMBULATORY_CARE_PROVIDER_SITE_OTHER): Payer: Self-pay

## 2012-06-21 ENCOUNTER — Other Ambulatory Visit (INDEPENDENT_AMBULATORY_CARE_PROVIDER_SITE_OTHER): Payer: Self-pay | Admitting: General Surgery

## 2012-06-21 ENCOUNTER — Telehealth (INDEPENDENT_AMBULATORY_CARE_PROVIDER_SITE_OTHER): Payer: Self-pay | Admitting: General Surgery

## 2012-06-21 DIAGNOSIS — C50919 Malignant neoplasm of unspecified site of unspecified female breast: Secondary | ICD-10-CM

## 2012-06-21 NOTE — Addendum Note (Signed)
Addendum  created 06/21/12 0908 by Jewel Baize Derk Doubek, CRNA   Modules edited:Anesthesia Responsible Staff

## 2012-06-21 NOTE — Telephone Encounter (Signed)
Dr. Adolphus Birchwood at [facility] pathology called and told me that the lumpectomy specimen for Amber Santos shows a 1.4 cm area of grade 2 mixed Invasive and non-invasive ductal cancer. There are also 2 other foci of DCIS. Margins are negative. Breast diagnostic profile is pending.  I called Amber Santos and discussed this with her. Because of her family history, dense breasts, and possible multifocal disease we are going to schedule her for bilateral breast MRI.  She has an appointment to see me on September 25.  I called Dr. Darnelle Catalan, who is on the [facility] board with her, and he is going to schedule an appointment for her to see him on October 4.  I told her that we would at least need to do a sentinel node biopsy, but that we did not want to plan any surgery until she had the MRI, saw Dr. Darnelle Catalan, and possibly was referred for genetic counseling.  Pattricia Boss our office is going to arrange the MRI and to confirm the followup appointment with me.   [name]. [name], M.D., [facility] General and Minimally invasive Surgery Breast and Colorectal Surgery Office:   [phone] Pager:   [phone]

## 2012-06-21 NOTE — Telephone Encounter (Signed)
Patient calling in for pathology results from Breast Mastectomy on 06/17/12.  Patient advised that pathology results typically take 3 business day's and we will contact her as soon as the results are available.

## 2012-06-21 NOTE — Addendum Note (Signed)
Addendum  created 06/21/12 0908 by Daquawn Seelman D Pooja Camuso, CRNA   Modules edited:Anesthesia Responsible Staff    

## 2012-06-23 ENCOUNTER — Telehealth: Payer: Self-pay | Admitting: *Deleted

## 2012-06-23 NOTE — Telephone Encounter (Signed)
Patient returned my call & I confirmed 07/08/12 appt w/ pt.  Mailed before appt letter & packet to pt.  Emailed Dana at [facility] to make aware.  Took paperwork to Med Rec for chart.

## 2012-06-26 ENCOUNTER — Ambulatory Visit
Admission: RE | Admit: 2012-06-26 | Discharge: 2012-06-26 | Disposition: A | Discharge status: Home / Self Care | Payer: BC Managed Care – PPO | Source: Ambulatory Visit | Attending: General Surgery | Admitting: General Surgery

## 2012-06-26 DIAGNOSIS — C50919 Malignant neoplasm of unspecified site of unspecified female breast: Secondary | ICD-10-CM

## 2012-06-26 MED ORDER — GADOBENATE DIMEGLUMINE 529 MG/ML IV SOLN
19.0000 mL | Freq: Once | INTRAVENOUS | Status: AC | PRN
  Administered 2012-06-26: 19 mL via INTRAVENOUS

## 2012-06-29 ENCOUNTER — Ambulatory Visit (INDEPENDENT_AMBULATORY_CARE_PROVIDER_SITE_OTHER): Payer: BC Managed Care – PPO | Admitting: General Surgery

## 2012-06-29 ENCOUNTER — Encounter (INDEPENDENT_AMBULATORY_CARE_PROVIDER_SITE_OTHER): Payer: Self-pay | Admitting: General Surgery

## 2012-06-29 VITALS — BP 140/90 | HR 68 | Temp 98.0°F | Resp 18 | Ht 68.0 in | Wt 196.4 lb

## 2012-06-29 DIAGNOSIS — C50419 Malignant neoplasm of upper-outer quadrant of unspecified female breast: Secondary | ICD-10-CM

## 2012-06-29 NOTE — Patient Instructions (Addendum)
Your lumpectomy showed mixed invasive and noninvasive breast cancer. Margins are negative. Hormone receptors are positive. Herceptin is negative.  MRI shows biopsy changes in the left breast, otherwise there is no other abnormality in either breast, and the lymph nodes look negative  You will  likely be presented at breast conference next Wednesday.  You are scheduled to see Dr. Darnelle Catalan on October 4. He will discuss other options for  therapy and will  also discuss the value of genetic counseling and genetic testing.  After our discussion today, we have decided to tentatively schedule you for a left axillary sentinel node biopsy, possible axillary lymph node dissection. We will set that up on a date after you see Dr. Darnelle Catalan in case anything changes.      Sentinel Lymph Node Biopsy Care After Refer to this sheet over the next few weeks. These instructions give you general information on caring for yourself after you leave the hospital. Your doctor may also give you specific instructions. Your treatment has been planned according to the most current medical practices available. However, problems sometimes occur. If you have any problems or questions after discharge, call your caregiver. ACTIVITY  Limit your activity as directed.   Stop vigorous exercise as directed.   Refrain from repetitive motion on the side of surgery as directed.   You may shower 24 hours after your surgery.   It is okay to get the cut from surgery (incision) wet.   Gently pat the incision dry.   You should wear the surgical bra supplied to you (or one of your own) for the next 48 hours. You may remove the bra to shower.  INCISION  The paper tapes or skin adhesive strips should remain in place until your follow-up visit.   It is okay if the tapes fall off. There is no need to replace them if this happens.  DISCOMFORT  It is common to have discomfort (aching or pain) for the first few days after surgery.  This is normal.   Mild anti-inflammatory medicines taken regularly for the next 3 days after surgery are usually able to control any discomfort you may have. Take only as directed.   Report any pain not controlled by medicine to your surgeon or caregiver.  NUTRITION  You may resume your regular diet.   You may wish to eat a light meal for your first meal after surgery.  AFTER SURGERY  The color of your urine may be blue for the next 24 hours. This is due to the blue dye used during surgery. This is normal.   The dye may also cause a blue tint to your breast. This will go away.  SEEK IMMEDIATE MEDICAL CARE IF:   Your pain is not controlled by medicine.   You notice redness, swelling, or increased drainage at the surgery site.   You feel sick to your stomach (nausea) or vomit.  Document Released: 05/05/2004 Document Revised: 09/10/2011 Document Reviewed: 08/17/2011 [facility] Patient Information 2012 [place], [place].Sentinel Lymph Node Analysis in Breast Cancer Treatment WHAT IS A LYMPH NODE? Lymph nodes are little glands that lie along the lymph channels and serve to trap infections in the body. These are the small vessel-like structures that carry the fluid (lymph) from body tissues away to be filtered. These are the "glands" that feel swollen in the neck when you or your child has a sore throat. These glands in your armpit are where breast cancer tends to spread first. WHAT IS SENTINEL LYMPH  NODE ANALYSIS? Sentinel lymph node study is a new cancer diagnostic procedure. It aims to look at the most likely first spread of breast cancer. It involves looking at the lymph node or nodes where breast cancer is likely to travel next.  PROCEDURE  A small amount of blue dye and radioactive tracer are injected around the tumor in the breast. The dye and tracer follow the same path that a spreading cancer would be likely to follow. With the use of a Conservation officer, nature, the radioactive tracer can be  located in the lymph node that is the gatekeeper to other lymph nodes in the armpit. The sentinel lymph node is the first lymph node in a chain of lymph nodes that drain the lymph from the breast. The blue dye enables the surgeon to identify this sentinel node. This node can be removed and examined. If no cancer is found in this node, no further removal of lymph nodes is recommended. In this case, the spread of cancer to the other lymph nodes is rare. This eliminates any more surgery in the armpit and risk of complications. If the lymph node shows spread of the cancer from the breast, the other lymph nodes in the armpit are removed and analyzed. This helps the doctor and patient decide how much more surgery is needed and if chemotherapy and/or radiation treatment is necessary following the surgery.  BENEFITS  The pathology tests for this procedure are much more sensitive than was previously available.   This technique is thought to be a major improvement in the treatment of breast cancer.   This procedure allows many patients to avoid the effects of more extensive underarm lymph node removal and risk of complications (infection, bleeding, or severe arm swelling).   Survival rates from breast cancer are better and the risk of complications isreduced.  Document Released: 09/21/2005 Document Revised: 09/10/2011 Document Reviewed: 06/07/2007 [facility] Patient Information 2012 [place], [place].

## 2012-06-29 NOTE — Progress Notes (Addendum)
Patient ID: Amber Santos, female   DOB: [birth year], 61 y.o.   MRN: [id number]  Chief Complaint  Patient presents with  . Follow-up    part.masty-left    HPI Amber Santos is a 61 y.o. female.  She returns for discussion about management of her left breast cancer.  She was initially evaluated by radiology and underwent image guided biopsy which showed atypical ductal hyperplasia. On September 13  she underwent left partial mastectomy, upper outer quadrant with needle localization. Final pathology report showed invasive ductal carcinoma, 1.4 cm, with associated DCIS. There were one or 2 other small foci nearby which are DCIS. Margins are negative. Hormone receptor strongly positive, HER-2-negative, Ki 67 16%.  She went for MRI which shows surgical changes in the upper outer quadrant of the left breast but otherwise no other areas of enhancement in the breast and nodes looked negative.  Recall that family history is positive for breast cancer in the mother, a maternal aunt and a paternal first cousin.  I have discussed her care with Dr. Darnelle Catalan and  he is going to see her on October 4. We will put her on for breast conference next Wednesday, Oct 2.  She has done fairly well since the surgery. Not much pain. No wound problems.  I spent over an hour discussing her options for therapy and overall care plan with her and her husband.  We talked about the fact that she probably has had an adequate lumpectomy for that she'll need sentinel lymph node biopsy.  We discussed the fact that she might have to have an axillary dissection if the blue dye and and the radioactive tracer do not map to the nodes. We talked about the possibility of axillary dissection if she has 3 or more positive nodes. We also talked about the option of mastectomy and sentinel node biopsy, possible axillary dissection. We talked about plastic surgical procedures and staged reconstruction and symmetry procedures in the event she  chooses mastectomy..  At the end of the discussion  her comments were that she wanted to go ahead with the sentinel node biopsy, possible axillary dissection. She said that she would rather keep both breasts or have both breasts removed and that seemed simpler to her. I felt that was a reasonable attitude, but hopefully we can treat her with breast conservation surgery.  It was her desire to go ahead and schedule the SLN biopsy, possible axillary dissection and so we are going to do that but will set it up after she sees Dr. Darnelle Catalan to make sure that doesn't change anything. HPI  Past Medical History  Diagnosis Date  . Anemia   . No pertinent past medical history     Past Surgical History  Procedure Date  . Cesarean section 1986, 1987  . Neck surgery 2010    Family History  Problem Relation Age of Onset  . Cancer Mother     breast  . Cancer Maternal Aunt     breast, pancreatic  . Cancer Maternal Uncle     colon    Social History History  Substance Use Topics  . Smoking status: Former Smoker    Quit date: 06/17/1984  . Smokeless tobacco: Never Used  . Alcohol Use: Yes     weekends, events    No Known Allergies  No current outpatient prescriptions on file.    Review of Systems Review of Systems  Constitutional: Negative for fever, chills and unexpected weight change.  HENT: Negative  for hearing loss, congestion, sore throat, trouble swallowing and voice change.   Eyes: Negative for visual disturbance.  Respiratory: Negative for cough and wheezing.   Cardiovascular: Negative for chest pain, palpitations and leg swelling.  Gastrointestinal: Negative for nausea, vomiting, abdominal pain, diarrhea, constipation, blood in stool, abdominal distention and anal bleeding.  Genitourinary: Negative for hematuria, vaginal bleeding and difficulty urinating.  Musculoskeletal: Negative for arthralgias.  Skin: Negative for rash and wound.  Neurological: Negative for seizures,  syncope and headaches.  Hematological: Negative for adenopathy. Does not bruise/bleed easily.  Psychiatric/Behavioral: Negative for confusion.    Blood pressure 140/90, pulse 68, temperature 98 F (36.7 C), temperature source Oral, resp. rate 18, height 5\' 8"  (1.727 m), weight 196 lb 6.4 oz (89.086 kg).  Physical Exam Physical Exam  Constitutional: She is oriented to person, place, and time. She appears well-developed and well-nourished. No distress.  HENT:  Head: Normocephalic and atraumatic.  Nose: Nose normal.  Mouth/Throat: No oropharyngeal exudate.  Eyes: Conjunctivae normal and EOM are normal. Pupils are equal, round, and reactive to light. Left eye exhibits no discharge. No scleral icterus.  Neck: Neck supple. No JVD present. No tracheal deviation present. No thyromegaly present.  Cardiovascular: Normal rate, regular rhythm, normal heart sounds and intact distal pulses.   No murmur heard. Pulmonary/Chest: Effort normal and breath sounds normal. No respiratory distress. She has no wheezes. She has no rales. She exhibits no tenderness.       Incision upper outer left breast healing without hematoma or infection. Tissues soft.  Abdominal: Soft. Bowel sounds are normal. She exhibits no distension and no mass. There is no tenderness. There is no rebound and no guarding.  Musculoskeletal: She exhibits no edema and no tenderness.  Lymphadenopathy:    She has no cervical adenopathy.  Neurological: She is alert and oriented to person, place, and time. She exhibits normal muscle tone. Coordination normal.  Skin: Skin is warm. No rash noted. She is not diaphoretic. No erythema. No pallor.  Psychiatric: She has a normal mood and affect. Her behavior is normal. Judgment and thought content normal.    Data Reviewed I have reviewed her MRI and pathology report. I discussed her case with Dr. Darnelle Catalan  Assessment    Mixed invasive and noninvasive ductal carcinoma left breast, upper outer  quadrant, 1.4 cm component of invasive tumor. ER 100%, PR 100%, HER-2-negative, Ki 67 16%.  T1c, Nx.  She has had an adequate lumpectomy with negative margins.  Positive family history of breast cancer in the mother and 2 second line relatives.    Plan    Tentatively, we will schedule her for a left axillary sentinel lymph node biopsy, possible axilla lymph node dissection. I discussed the indications, details, techniques, and numerous risks of the surgery with her and her husband. She understands the issues. Al her questions are answered. She agrees with this plan.  She will have a medical oncology consultation with Dr. Darnelle Catalan on October 4. He will decide whether she should be referred to genetic counseling or not.  She will call me preop if she changes her mind.       [name]. [name], M.D., [facility] General and Minimally invasive Surgery Breast and Colorectal Surgery Office:   [phone] Pager:   [phone]  06/29/2012, 6:53 PM

## 2012-07-07 ENCOUNTER — Other Ambulatory Visit: Payer: Self-pay | Admitting: *Deleted

## 2012-07-07 DIAGNOSIS — C50419 Malignant neoplasm of upper-outer quadrant of unspecified female breast: Secondary | ICD-10-CM

## 2012-07-07 DIAGNOSIS — Z170 Estrogen receptor positive status [ER+]: Secondary | ICD-10-CM | POA: Insufficient documentation

## 2012-07-07 DIAGNOSIS — C50412 Malignant neoplasm of upper-outer quadrant of left female breast: Secondary | ICD-10-CM | POA: Insufficient documentation

## 2012-07-08 ENCOUNTER — Ambulatory Visit (HOSPITAL_BASED_OUTPATIENT_CLINIC_OR_DEPARTMENT_OTHER): Payer: BC Managed Care – PPO | Admitting: Oncology

## 2012-07-08 ENCOUNTER — Other Ambulatory Visit (HOSPITAL_BASED_OUTPATIENT_CLINIC_OR_DEPARTMENT_OTHER): Payer: BC Managed Care – PPO | Admitting: Lab

## 2012-07-08 ENCOUNTER — Telehealth: Payer: Self-pay | Admitting: *Deleted

## 2012-07-08 ENCOUNTER — Encounter: Payer: Self-pay | Admitting: Oncology

## 2012-07-08 ENCOUNTER — Ambulatory Visit: Payer: BC Managed Care – PPO

## 2012-07-08 VITALS — BP 129/74 | HR 76 | Temp 98.4°F | Resp 20 | Ht 68.0 in | Wt 194.9 lb

## 2012-07-08 DIAGNOSIS — N6099 Unspecified benign mammary dysplasia of unspecified breast: Secondary | ICD-10-CM

## 2012-07-08 DIAGNOSIS — C50419 Malignant neoplasm of upper-outer quadrant of unspecified female breast: Secondary | ICD-10-CM

## 2012-07-08 DIAGNOSIS — N6089 Other benign mammary dysplasias of unspecified breast: Secondary | ICD-10-CM

## 2012-07-08 DIAGNOSIS — Z170 Estrogen receptor positive status [ER+]: Secondary | ICD-10-CM

## 2012-07-08 LAB — COMPREHENSIVE METABOLIC PANEL (CC13)
ALT: 43 U/L (ref 0–55)
AST: 38 U/L — ABNORMAL HIGH (ref 5–34)
Albumin: 4 g/dL (ref 3.5–5.0)
Alkaline Phosphatase: 94 U/L (ref 40–150)
BUN: 21 mg/dL (ref 7.0–26.0)
Calcium: 9.5 mg/dL (ref 8.4–10.4)
Chloride: 104 mEq/L (ref 98–107)
Potassium: 4 mEq/L (ref 3.5–5.1)
Sodium: 139 mEq/L (ref 136–145)

## 2012-07-08 LAB — CBC WITH DIFFERENTIAL/PLATELET
BASO%: 0.8 % (ref 0.0–2.0)
Basophils Absolute: 0 10*3/uL (ref 0.0–0.1)
EOS%: 1.5 % (ref 0.0–7.0)
HGB: 12.6 g/dL (ref 11.6–15.9)
MCH: 27.4 pg (ref 25.1–34.0)
MCHC: 33.3 g/dL (ref 31.5–36.0)
MCV: 82 fL (ref 79.5–101.0)
MONO%: 10.9 % (ref 0.0–14.0)
RBC: 4.62 10*6/uL (ref 3.70–5.45)
RDW: 12.6 % (ref 11.2–14.5)
lymph#: 1.4 10*3/uL (ref 0.9–3.3)

## 2012-07-08 NOTE — Progress Notes (Signed)
ID: Amber Santos   DOB: [birth year]  MR#: [id number]  CSN#:[id number]  PCP: Annamaria Boots, MD GYN:  SU: Claud Kelp MD OTHER MD:   HISTORY OF PRESENT ILLNESS: Amber Santos had routine screening mammography at [facility] 05/23/2022 showing new calcifications in the left breast. Diagnostic left mammography 05/27/2012 confirmed a cluster of faint calcifications in the upper outer quadrant. Biopsy Santos performed 06/01/2012, and showed atypical ductal hyperplasia ([id number]). Accordingly the patient underwent left lumpectomy 06/17/2012. This showed ([id number]) invasive ductal carcinoma, grade 2, measuring 1.4 cm, with associated high-grade ductal carcinoma in situ. There were 2 additional areas of ductal carcinoma in situ, but margins were clear. The invasive tumor Santos 100% estrogen receptor and 100% progesterone receptor positive, with an MIB-1 of 20%. It Santos HER-2 negative.   On 06/26/2012 the patient underwent bilateral breast MRI. This found a seroma in the upper outer quadrant of the left breast measuring 6 cm. There Santos no other area of suspicious enhancement in the left breast, no findings of concern in the right breast, and no axillary or internal mammary adenopathy. The patient's subsequent history is as detailed below  INTERVAL HISTORY: Amber Santos seen with her husband Jesusita Oka on 07/08/2012 for further discussion of her prognosis and treatment options.  REVIEW OF SYSTEMS: She tolerated her left lumpectomy without unusual pain, fever, bleeding, redness, or swelling. A detailed review of systems today Santos otherwise negative   PAST MEDICAL HISTORY: Past Medical History  Diagnosis Date  . Anemia   . No pertinent past medical history     PAST SURGICAL HISTORY: Past Surgical History  Procedure Date  . Cesarean section 1986, 1987  . Neck surgery 2010    FAMILY HISTORY Family History  Problem Relation Age of Onset  . Cancer Mother     breast  . Cancer Maternal Aunt    breast, pancreatic  . Cancer Maternal Uncle     colon    GYNECOLOGIC HISTORY:  SOCIAL HISTORY:    ADVANCED DIRECTIVES:  HEALTH MAINTENANCE: History  Substance Use Topics  . Smoking status: Former Smoker    Quit date: 06/17/1984  . Smokeless tobacco: Never Used  . Alcohol Use: Yes     weekends, events     Colonoscopy:  PAP:  Bone density: [facility]l  Lipid panel:  No Known Allergies  No current outpatient prescriptions on file.    OBJECTIVE: Middle-aged white woman who appears well Filed Vitals:   07/08/12 1111  BP: 129/74  Pulse: 76  Temp: 98.4 F (36.9 C)  Resp: 20     Body mass index is 29.63 kg/(m^2).    ECOG FS: 0  Sclerae unicteric Oropharynx clear No cervical or supraclavicular adenopathy Lungs no rales or rhonchi Heart regular rate and rhythm Abd benign MSK no focal spinal tenderness, no peripheral edema Neuro: nonfocal Breasts: The right breast is unremarkable. The left breast is status post recent lumpectomy. An area of firmness palpated laterally likely represents the seroma noted on MRI. This appears smaller than previously noted. Left axilla is entirely benign.  LAB RESULTS: Lab Results  Component Value Date   WBC 3.4* 07/08/2012   NEUTROABS 1.6 07/08/2012   HGB 12.6 07/08/2012   HCT 37.9 07/08/2012   MCV 82.0 07/08/2012   PLT 264 07/08/2012      Chemistry   No results found for this basename: NA, K, CL, CO2, BUN, CREATININE, GLU   No results found for this basename: CALCIUM, ALKPHOS, AST, ALT, BILITOT  No results found for this basename: LABCA2    No components found with this basename: LABCA125    No results found for this basename: INR:1;PROTIME:1 in the last 168 hours  Urinalysis No results found for this basename: colorurine, appearanceur, labspec, phurine, glucoseu, hgbur, bilirubinur, ketonesur, proteinur, urobilinogen, nitrite, leukocytesur    STUDIES: Mr Breast Bilateral W Wo Contrast  06/27/2012   *RADIOLOGY REPORT*  Clinical Data:  New diagnosis left sided breast cancer. The patient had a biopsy showing atypical ductal hyperplasia of which, on excision showed invasive ductal carcinoma and DCIS. Family history of breast cancer including mother, cousin and aunt.  BILATERAL BREAST MRI WITH AND WITHOUT CONTRAST  Technique: Multiplanar, multisequence MR images of both breasts were obtained prior to and following the intravenous administration of 19ml of Multihance.  Three dimensional images were evaluated at the independent DynaCad workstation.  Comparison:  10/19/2005.  Findings: Foci of nonspecific enhancement seen bilaterally.  The left breast is smaller than the right with a thin, rim enhancing seroma in the upper-outer quadrant of the left breast posterior and middle third at the lumpectomy site measuring 3.8 x 2.7 x 6.0 cm. No suspicion enhancement seen in the left breast.  No suspicious enhancement or mass is seen in the right breast.  No axillary or internal mammary adenopathy is seen  IMPRESSION: Postsurgical changes seen in the upper-outer quadrant of the left breast without residual enhancement or mass.  No MRI specific evidence of malignancy, right breast.  RECOMMENDATION: Treatment plan, left breast.  THREE-DIMENSIONAL MR IMAGE RENDERING ON INDEPENDENT WORKSTATION:  Three-dimensional MR images were rendered by post-processing of the original MR data on an independent workstation.  The three- dimensional MR images were interpreted, and findings were reported in the accompanying complete MRI report for this study.  BI-RADS CATEGORY 6:  Known biopsy-proven malignancy - appropriate action should be taken.   Original Report Authenticated By: Hiram Gash, M.D.    Mm Breast Surgical Specimen  06/17/2012  *RADIOLOGY REPORT*  Clinical Data:  Atypical ductal hyperplasia diagnosed following biopsy of left breast calcifications in August 2013.  NEEDLE LOCALIZATION WITH MAMMOGRAPHIC GUIDANCE AND  SPECIMEN RADIOGRAPH  Comparison:  Previous exams.  Patient presents for needle localization prior to excisional biopsy.  I met with the patient and we discussed the procedure of needle localization including benefits and alternatives. We discussed the high likelihood of a successful procedure. We discussed the risks of the procedure, including infection, bleeding, tissue injury, and further surgery. Informed, written consent Santos given.  Using mammographic guidance, sterile technique, 2% lidocaine and a 9 cm modified Kopans needle, eight biopsy clip at the site of prior stereotactic biopsy Santos localized using a lateral to medial approach.  The films are marked for Dr. [name].  Specimen radiograph [facility], and confirms the biopsy clip, a few faint calcifications, and an intact wire present in the tissue sample.  The specimen is marked for pathology.  IMPRESSION: Needle localization of the left breast breast.  No apparent complications.   Original Report Authenticated By: Britta Mccreedy, M.D.    Mm Breast Wire Localization Left  06/17/2012  *RADIOLOGY REPORT*  Clinical Data:  Atypical ductal hyperplasia diagnosed following biopsy of left breast calcifications in August 2013.  NEEDLE LOCALIZATION WITH MAMMOGRAPHIC GUIDANCE AND SPECIMEN RADIOGRAPH  Comparison:  Previous exams.  Patient presents for needle localization prior to excisional biopsy.  I met with the patient and we discussed the procedure of needle localization including benefits and alternatives. We  discussed the high likelihood of a successful procedure. We discussed the risks of the procedure, including infection, bleeding, tissue injury, and further surgery. Informed, written consent Santos given.  Using mammographic guidance, sterile technique, 2% lidocaine and a 9 cm modified Kopans needle, eight biopsy clip at the site of prior stereotactic biopsy Santos localized using a lateral to medial approach.  The films are marked for Dr. [name].   Specimen radiograph Santos performed at [facility], and confirms the biopsy clip, a few faint calcifications, and an intact wire present in the tissue sample.  The specimen is marked for pathology.  IMPRESSION: Needle localization of the left breast breast.  No apparent complications.   Original Report Authenticated By: Britta Mccreedy, M.D.     ASSESSMENT: 61 y.o. [place] woman status post left lumpectomy with no axillary lymph node sampling 06/17/2012 for a pT1c cN0, clinical stage IA invasive ductal carcinoma, grade 2, 100% estrogen and 100% progesterone receptor positive, HER-2 negative, with an MIB-1 of 16%.  PLAN: She is scheduled for sentinel lymph node sampling later this month, and we'll see me a week after that procedure. In the meantime we are requesting an Oncotype on her invasive tumor. I am hopeful it will be on the low side, since according to the adjuvant! database her benefit from aggressive chemotherapy in terms of reduced risk of relapse would be in the 4 % range.   We spent the better part of her hour-long visit discussing the biology of her tumor, treatment options for breast cancer, and the nature of the Oncotype database. If we decided against chemotherapy, as seems likely, she would proceed to radiation treatments, and she requests Dr. Hal Neer out to be her radiation oncologist. We would then start antiestrogen therapy. We'll continue to discuss these options once we have her final pathology from the sentinel lymph node sampling later this month. She knows to call for any problems that may develop before the next visit.   MAGRINAT,GUSTAV C    07/08/2012

## 2012-07-08 NOTE — Telephone Encounter (Signed)
Gave patient appointment for chemo school for 07-12-2012 at 5:00pm  Gave patient appointment for dr,wentworth for 07-14-2012 starting at 10:30am  Gave patient appointment for return office appointment for 08-02-2012 at 5:00pm  Patient aware of all appointments

## 2012-07-08 NOTE — Progress Notes (Signed)
Checked in new pt with no financial concerns. °

## 2012-07-12 ENCOUNTER — Encounter: Payer: Self-pay | Admitting: *Deleted

## 2012-07-12 ENCOUNTER — Other Ambulatory Visit: Payer: BC Managed Care – PPO

## 2012-07-13 ENCOUNTER — Encounter: Payer: Self-pay | Admitting: Radiation Oncology

## 2012-07-13 NOTE — Progress Notes (Signed)
61 year old female. Married.   Status post left lumpectomy with no axillary lymph node sampling 06/17/12. Invasive ductal carcinoma ER/PR positive, HER 2 negative. Sentinel lymph node sampling scheduled for later this month. Oncotype testing sent.   NKDA No indication of a pacemaker No hx of radiation therapy

## 2012-07-14 ENCOUNTER — Ambulatory Visit
Admission: RE | Admit: 2012-07-14 | Discharge: 2012-07-14 | Disposition: A | Discharge status: Home / Self Care | Payer: BC Managed Care – PPO | Source: Ambulatory Visit | Attending: Radiation Oncology | Admitting: Radiation Oncology

## 2012-07-14 ENCOUNTER — Encounter: Payer: Self-pay | Admitting: Radiation Oncology

## 2012-07-14 VITALS — BP 118/87 | HR 70 | Temp 98.1°F | Resp 18 | Ht 68.0 in | Wt 197.8 lb

## 2012-07-14 DIAGNOSIS — C50419 Malignant neoplasm of upper-outer quadrant of unspecified female breast: Secondary | ICD-10-CM

## 2012-07-14 DIAGNOSIS — Z510 Encounter for antineoplastic radiation therapy: Secondary | ICD-10-CM | POA: Insufficient documentation

## 2012-07-14 DIAGNOSIS — Z87891 Personal history of nicotine dependence: Secondary | ICD-10-CM | POA: Insufficient documentation

## 2012-07-14 DIAGNOSIS — L988 Other specified disorders of the skin and subcutaneous tissue: Secondary | ICD-10-CM | POA: Insufficient documentation

## 2012-07-14 DIAGNOSIS — Z170 Estrogen receptor positive status [ER+]: Secondary | ICD-10-CM | POA: Insufficient documentation

## 2012-07-14 NOTE — Progress Notes (Signed)
Patient and her husband presented to the clinic today for a consultation with Dr. Michell Heinrich to discuss the role of radiation therapy in the treatment of left breast ca. Patient is alert and oriented to person, place, and time. No distress noted. Steady gait noted. Pleasant affect noted. Patient denies pain at this time. Patient reports that her breast isn't even really sore anymore. Patient reports that the incision on her left breast from her lumpectomy are well approximated without redness, drainage or edema. Patient denies nipple discharge. Patient demonstrates full ROM in both upper extremities. Patient denies swelling in either arm. Patient denies nausea, vomiting, headache, dizziness or diarrhea. Patient reports that following surgery she dealt with constipation. Provided patient with a few tips to prevent constipation with her surgery on 10/21. Reported all findings to Dr. Michell Heinrich.

## 2012-07-14 NOTE — Progress Notes (Signed)
Complete PATIENT MEASURE OF DISTRESS worksheet with a score of 5 submitted to social work.  

## 2012-07-14 NOTE — Progress Notes (Signed)
Radiation Oncology         [phone]) [phone] ________________________________  Initial outpatient Consultation  Name: Amber Santos MRN: [id number]  Date: 07/14/2012  DOB: 1950/10/10  REFERRING PHYSICIAN: Magrinat, Valentino Hue, MD  DIAGNOSIS: The encounter diagnosis was Cancer of upper-outer quadrant of female breast.  HISTORY OF PRESENT ILLNESS::Amber Santos is a 61 y.o. female  who underwent screening mammograms. New calcifications were noted in the upper outer quadrant of the left breast. A diagnostic mammogram confirmed these findings. The biopsy was performed on August 28 which showed atypical ductal hyperplasia. Excision was recommended and she underwent a left lumpectomy on 06/17/2012 showing invasive ductal carcinoma grade 2 measuring 1.4 cm with associated high-grade DCIS. 2 additional areas of ductal carcinoma in situ were noted but the margins were clear. Invasive tumor was 100% ER and PR positive with a Ki-67 of 20%. HER-2 was negative. Bilateral breast MRIs on 06/26/2012 showed a seroma in the upper outer quadrant measuring about 6 cm. No other areas suspicious enhancement were noted. No adenopathy was noted. She met back with Dr. [name] who discussed sentinel lymph node biopsy given the new diagnosis of invasive tumor. She also met with Dr. Daneil Dolin not he discussed Oncotype testing and the possibility of chemotherapy with her. At her request she went ahead and attended the chemotherapy education class which she finds very helpful. She was referred to me by Dr. Darnelle Catalan.  PREVIOUS RADIATION THERAPY: No  PAST MEDICAL HISTORY:  has a past medical history of Anemia; No pertinent past medical history; and Breast cancer.    PAST SURGICAL HISTORY: Past Surgical History  Procedure Date  . Cesarean section 1986, 1987  . Neck surgery 2010  . Breast lumpectomy 06/17/2012    left breast lumpectomy  . Breast biopsy 06/01/2012    left     FAMILY HISTORY: family history includes Cancer in her  maternal aunt, maternal uncle, and mother and Cancer (age of onset:28) in her maternal aunt.  SOCIAL HISTORY:  reports that she quit smoking about 28 years ago. She has never used smokeless tobacco. She reports that she drinks alcohol. She reports that she does not use illicit drugs.  ALLERGIES: Review of patient's allergies indicates no known allergies.  MEDICATIONS:  Current Outpatient Prescriptions  Medication Sig Dispense Refill  . nitrofurantoin, macrocrystal-monohydrate, (MACROBID) 100 MG capsule         REVIEW OF SYSTEMS:  A 15 point review of systems is documented in the electronic medical record. This was obtained by the nursing staff. However, I reviewed this with the patient to discuss relevant findings and make appropriate changes.  Pertinent items are noted in HPI.   PHYSICAL EXAM:  height is 5\' 8"  (1.727 m) and weight is 197 lb 12.8 oz (89.721 kg). Her oral temperature is 98.1 F (36.7 C). Her blood pressure is 118/87 and her pulse is 70. Her respiration is 18.   . She is a pleasant female who appears her stated age sitting comfortably examining table. She has no palpable axillary or supraclavicular adenopathy. She has large pendulous breasts bilaterally. She has no palpable areas of concern in the right breast. She has a palpable seroma cavity underneath her surgical incision in the left breast. She really has an excellent cosmetic result. She has no lymphedema of the bilateral arms. Her grip is 5 out of 5 in her bilateral upper extremities.  LABORATORY DATA:  Lab Results  Component Value Date   WBC 3.4* 07/08/2012   HGB 12.6  07/08/2012   HCT 37.9 07/08/2012   MCV 82.0 07/08/2012   PLT 264 07/08/2012   Lab Results  Component Value Date   NA 139 07/08/2012   K 4.0 07/08/2012   CL 104 07/08/2012   CO2 24 07/08/2012   Lab Results  Component Value Date   ALT 43 07/08/2012   AST 38* 07/08/2012   ALKPHOS 94 07/08/2012   BILITOT 0.80 07/08/2012     RADIOGRAPHY: Mr Breast  Bilateral W Wo Contrast  06/27/2012  *RADIOLOGY REPORT*  Clinical Data:  New diagnosis left sided breast cancer. The patient had a biopsy showing atypical ductal hyperplasia of which, on excision showed invasive ductal carcinoma and DCIS. Family history of breast cancer including mother, cousin and aunt.  BILATERAL BREAST MRI WITH AND WITHOUT CONTRAST  Technique: Multiplanar, multisequence MR images of both breasts were obtained prior to and following the intravenous administration of 19ml of Multihance.  Three dimensional images were evaluated at the independent DynaCad workstation.  Comparison:  10/19/2005.  Findings: Foci of nonspecific enhancement seen bilaterally.  The left breast is smaller than the right with a thin, rim enhancing seroma in the upper-outer quadrant of the left breast posterior and middle third at the lumpectomy site measuring 3.8 x 2.7 x 6.0 cm. No suspicion enhancement seen in the left breast.  No suspicious enhancement or mass is seen in the right breast.  No axillary or internal mammary adenopathy is seen  IMPRESSION: Postsurgical changes seen in the upper-outer quadrant of the left breast without residual enhancement or mass.  No MRI specific evidence of malignancy, right breast.  RECOMMENDATION: Treatment plan, left breast.  THREE-DIMENSIONAL MR IMAGE RENDERING ON INDEPENDENT WORKSTATION:  Three-dimensional MR images were rendered by post-processing of the original MR data on an independent workstation.  The three- dimensional MR images were interpreted, and findings were reported in the accompanying complete MRI report for this study.  BI-RADS CATEGORY 6:  Known biopsy-proven malignancy - appropriate action should be taken.   Original Report Authenticated By: Hiram Gash, M.D.    Mm Breast Surgical Specimen  06/17/2012  *RADIOLOGY REPORT*  Clinical Data:  Atypical ductal hyperplasia diagnosed following biopsy of left breast calcifications in August 2013.  NEEDLE LOCALIZATION  WITH MAMMOGRAPHIC GUIDANCE AND SPECIMEN RADIOGRAPH  Comparison:  Previous exams.  Patient presents for needle localization prior to excisional biopsy.  I met with the patient and we discussed the procedure of needle localization including benefits and alternatives. We discussed the high likelihood of a successful procedure. We discussed the risks of the procedure, including infection, bleeding, tissue injury, and further surgery. Informed, written consent was given.  Using mammographic guidance, sterile technique, 2% lidocaine and a 9 cm modified Kopans needle, eight biopsy clip at the site of prior stereotactic biopsy was localized using a lateral to medial approach.  The films are marked for Dr. [name].  Specimen radiograph was performed at [facility], and confirms the biopsy clip, a few faint calcifications, and an intact wire present in the tissue sample.  The specimen is marked for pathology.  IMPRESSION: Needle localization of the left breast breast.  No apparent complications.   Original Report Authenticated By: Britta Mccreedy, M.D.    Mm Breast Wire Localization Left  06/17/2012  *RADIOLOGY REPORT*  Clinical Data:  Atypical ductal hyperplasia diagnosed following biopsy of left breast calcifications in August 2013.  NEEDLE LOCALIZATION WITH MAMMOGRAPHIC GUIDANCE AND SPECIMEN RADIOGRAPH  Comparison:  Previous exams.  Patient presents for needle localization prior to  excisional biopsy.  I met with the patient and we discussed the procedure of needle localization including benefits and alternatives. We discussed the high likelihood of a successful procedure. We discussed the risks of the procedure, including infection, bleeding, tissue injury, and further surgery. Informed, written consent was given.  Using mammographic guidance, sterile technique, 2% lidocaine and a 9 cm modified Kopans needle, eight biopsy clip at the site of prior stereotactic biopsy was localized using a lateral to medial approach.  The  films are marked for Dr. [name].  Specimen radiograph was performed at [facility], and confirms the biopsy clip, a few faint calcifications, and an intact wire present in the tissue sample.  The specimen is marked for pathology.  IMPRESSION: Needle localization of the left breast breast.  No apparent complications.   Original Report Authenticated By: Britta Mccreedy, M.D.       IMPRESSION: T1 C. N0 invasive ductal carcinoma of the left breast status post lumpectomy with pending axillary nodal status  PLAN: I discussed with color and her husband today her prognosis and options for treatment. We discussed the role of radiation in decreasing local failures in women who undergo lumpectomy. We discussed the process of simulation the placement tattoos. We discussed breath-hold treatments to prevent cardiac toxicity. We discussed 6 and half weeks of treatment as an outpatient. We discussed that this would start as soon as she was cleared by Dr. [name] and the met with Dr. Daneil Dolin not regarding chemotherapy. We discussed that she did need chemotherapy that would occur before radiation. I believe Dr. Daneil Dolin not has ordered an Oncotype on her original tumor so hopefully we'll have an answer with her sentinel lymph node biopsy. I have tentatively scheduled her for simulation on October 31 in order not to delay her care any further. I do not see that she has been referred for genetic counseling. I will check with Dr. Darnelle Catalan regarding this.  I spent 60 minutes  face to face with the patient and more than 50% of that time was spent in counseling and/or coordination of care.   ------------------------------------------------  Lurline Hare, MD

## 2012-07-14 NOTE — Progress Notes (Signed)
See progress note under physician encounter. 

## 2012-07-19 ENCOUNTER — Encounter (HOSPITAL_BASED_OUTPATIENT_CLINIC_OR_DEPARTMENT_OTHER): Payer: Self-pay | Admitting: *Deleted

## 2012-07-19 ENCOUNTER — Encounter: Payer: BC Managed Care – PPO | Admitting: Genetic Counselor

## 2012-07-19 ENCOUNTER — Other Ambulatory Visit: Payer: BC Managed Care – PPO | Admitting: Lab

## 2012-07-20 ENCOUNTER — Telehealth: Payer: Self-pay | Admitting: *Deleted

## 2012-07-20 NOTE — Progress Notes (Signed)
Pt here for lump 9/13-did well-very nervous, but not taking any meds No labs needed To bring overnight bag just in case she has to stay [facility]

## 2012-07-20 NOTE — Telephone Encounter (Signed)
Patient returned my call and I confirmed 07/21/12 genetic appt w/ pt.

## 2012-07-20 NOTE — Telephone Encounter (Signed)
Pt called stating that she missed her genetic appt yesterday and needed to get rescheduled before 10/28.  I called and left a message for pt at home to call me back so I can get her rescheduled.

## 2012-07-21 ENCOUNTER — Other Ambulatory Visit: Payer: BC Managed Care – PPO | Admitting: Lab

## 2012-07-21 ENCOUNTER — Encounter: Payer: Self-pay | Admitting: Genetic Counselor

## 2012-07-21 ENCOUNTER — Ambulatory Visit (HOSPITAL_BASED_OUTPATIENT_CLINIC_OR_DEPARTMENT_OTHER): Payer: BC Managed Care – PPO | Admitting: Genetic Counselor

## 2012-07-21 DIAGNOSIS — Z808 Family history of malignant neoplasm of other organs or systems: Secondary | ICD-10-CM

## 2012-07-21 DIAGNOSIS — Z803 Family history of malignant neoplasm of breast: Secondary | ICD-10-CM

## 2012-07-21 DIAGNOSIS — IMO0002 Reserved for concepts with insufficient information to code with codable children: Secondary | ICD-10-CM

## 2012-07-21 DIAGNOSIS — C50419 Malignant neoplasm of upper-outer quadrant of unspecified female breast: Secondary | ICD-10-CM

## 2012-07-21 NOTE — Progress Notes (Signed)
Dr.  Raymond Gurney Magrinat requested a consultation for genetic counseling and risk assessment for Amber Santos, a 61 y.o. female, for discussion of her personal history of breast cancer, and family history of breast, pancreatic, colon and kidney cancer. She presents to clinic today to discuss the possibility of a genetic predisposition to cancer, and to further clarify her risks, as well as her family members' risks for cancer.   HISTORY OF PRESENT ILLNESS: In 2013, at the age of 68, Amber Santos was diagnosed with breast cancer.    Past Medical History  Diagnosis Date  . Anemia   . Breast cancer     high grade ductal ca left breast    Past Surgical History  Procedure Date  . Cesarean section 1986, 1987  . Anterior cervical decomp/discectomy fusion 01/10/2009    C4-7  . Breast lumpectomy 06/17/2012    left partial mastectomy    History  Substance Use Topics  . Smoking status: Former Smoker -- 0.5 packs/day for 12 years    Quit date: 06/17/1984  . Smokeless tobacco: Never Used  . Alcohol Use: Yes     weekends, events    REPRODUCTIVE HISTORY AND PERSONAL RISK ASSESSMENT FACTORS: Menarche was at age 70.   Menopause at 54 Uterus Intact: Yes Ovaries Intact: Yes G2P2A0 , first live birth at age 45  She has previously undergone treatment for infertility.   OCP use for 4 years   She has used HRT in the past.    FAMILY HISTORY:  We obtained a detailed, 4-generation family history.  Significant diagnoses are listed below: Family History  Problem Relation Age of Onset  . Breast cancer Mother 19  . Cancer Maternal Aunt     breast, pancreatic  . Colon cancer Maternal Uncle   . Breast cancer Maternal Aunt 28  . Colon cancer Maternal Grandmother   . Kidney cancer Maternal Uncle   . Breast cancer Cousin     2 maternal cousins dx in 48s  the patient was diagnosed with breast cancer at age 79.  The patients mother was diagnosed at age 71 with breast cancer.  Her mother had three  sisters and three brothers.  Two of her mother's sisters, who were fraternal twins, were diagnosed with breast cancer.  One was diagnosed at age 30 and died at 22, and her daughter was also diagnosed with breast cancer in her 63s.  The other sister was diagnosed with breast cancer at 8 and pancreatic cancer at 57.  One brother had colon cancer over the age of 92 and the other had kidney cancer over the age of 18s.  One of these brothers had a daughter with breast cancer.  The patient's maternal grandmother had colon cancer in her 74s.  She had a brother who had an unknown cancer. His two children had unknown cancers and one of them had a daughter with breast cancer.  The patient also reports a paternal cousin with breast cancer at age 12.  Patient's maternal ancestors are of unknown descent, and paternal ancestors are of unknown descent. There is no reported Ashkenazi Jewish ancestry. There is no  known consanguinity.  GENETIC COUNSELING RISK ASSESSMENT, DISCUSSION, AND SUGGESTED FOLLOW UP: We reviewed the natural history and genetic etiology of sporadic, familial and hereditary cancer syndromes.  About 5-10% of breast cancer is hereditary.  Of this, about 85% is the result of a BRCA1 or BRCA2 mutation.  We reviewed the red flags of hereditary cancer syndromes  and the dominant inheritance patterns.  If the BRCA testing is negative, we discussed that we could be testing for the wrong gene.  We discussed gene panels, and that several cancer genes that are associated with different cancers can be tested at the same time.  Because of the different types of cancer that are in the patient's family, we will consider the CancerNext panel testing.   The patient's personal history of breast cancer and family history of breast, pancreatic, kidney, and colon cancer is suggestive of the following possible diagnosis: hereditary cancer syndrome  We discussed that identification of a hereditary cancer syndrome may help  her care providers tailor the patients medical management. If a mutation indicating a hereditary cancer syndrome is detected in this case, the [employer] recommendations would include increased cancer surveillance and possible prophylactic surgery. If a mutation is detected, the patient will be referred back to the referring provider and to any additional appropriate care providers to discuss the relevant options.   If a mutation is not found in the patient, this will decrease the likelihood of a hereditary cancer syndrome as the explanation for her breast cancer. Cancer surveillance options would be discussed for the patient according to the appropriate standard National Comprehensive Cancer Network and American Cancer Society guidelines, with consideration of their personal and family history risk factors. In this case, the patient will be referred back to their care providers for discussions of management.   In order to estimate her chance of having a BRCA mutation, we used statistical models (PEnn II) and laboratory data that take into account her personal medical history, family history and ancestry.  Because each model is different, there can be a lot of variability in the risks they give.  Therefore, these numbers must be considered a rough range and not a precise risk of having a BRCA mutation.  These models estimate that she has approximately a 39% chance of having a mutation. Based on this assessment of her family and personal history, genetic testing is recommended.  After considering the risks, benefits, and limitations, the patient provided informed consent for  the following  testing: CancerNext through [employer]   Per the patient's request, we will contact her by telephone to discuss these results. A follow up genetic counseling visit will be scheduled if indicated.  The patient was seen for a total of 60 minutes, greater than 50% of which was spent  face-to-face counseling.  This plan is being carried out per Dr. Darrall Dears recommendations.  This note will also be sent to the referring provider via the electronic medical record. The patient will be supplied with a summary of this genetic counseling discussion as well as educational information on the discussed hereditary cancer syndromes following the conclusion of their visit.   Patient was discussed with Dr. Drue Second.  EDUCATIONAL INFORMATION SUPPLIED TO PATIENT AT ENCOUNTER:  Hereditary breast and ovarian cancer syndrome brochure   _______________________________________________________________________ For Office Staff:  Number of people involved in session: 2 Was an Intern/ student involved with case: no

## 2012-07-22 NOTE — H&P (Signed)
[name]     MRN: [id number]   Description: 61 year old female  Provider: [name], MD  Department: [facility]       Diagnoses     Cancer of upper-outer quadrant of female breast   - Primary    174.4      Reason for Visit     Follow-up    part.masty-left        Vitals      BP Pulse Temp Resp Ht Wt    140/90 68 98 F (36.7 C) (Oral) 18 5\' 8"  (1.727 m) 196 lb 6.4 oz (89.086 kg)    BMI - 29.86 kg/m2                 History and Physical     [name], MD   Patient ID: Amber Santos, female   DOB: [birth year], 61 y.o.   MRN: [id number]               HPI Amber Santos is a 61 y.o. female.  She returns for discussion about management of her left breast cancer.   She was initially evaluated by radiology and underwent image guided biopsy which showed atypical ductal hyperplasia. On September 13  she underwent left partial mastectomy, upper outer quadrant with needle localization. Final pathology report showed invasive ductal carcinoma, 1.4 cm, with associated DCIS. There were one or 2 other small foci nearby which are DCIS. Margins are negative. Hormone receptor strongly positive, HER-2-negative, Ki 67 16%.   She went for MRI which shows surgical changes in the upper outer quadrant of the left breast but otherwise no other areas of enhancement in the breast and nodes looked negative.   Recall that family history is positive for breast cancer in the mother, a maternal aunt and a paternal first cousin.   I have discussed her care with Dr. Darnelle Catalan.   She has done fairly well since the surgery. Not much pain. No wound problems.   I spent over an hour discussing her options for therapy and overall care plan with her and her husband.  We talked about the fact that she probably has had an adequate lumpectomy for that she'll need sentinel lymph node biopsy.  We discussed the fact that she might have to have an axillary dissection if the  blue dye and and the radioactive tracer do not map to the nodes. We talked about the possibility of axillary dissection if she has 3 or more positive nodes. We also talked about the option of mastectomy and sentinel node biopsy, possible axillary dissection. We talked about plastic surgical procedures and staged reconstruction and symmetry procedures in the event she chooses mastectomy..   At the end of the discussion  her comments were that she wanted to go ahead with the sentinel node biopsy, possible axillary dissection. She said that she would rather keep both breasts or have both breasts removed and that seemed simpler to her. I felt that was a reasonable attitude, but hopefully we can treat her with breast conservation surgery.   It was her desire to go ahead and schedule the SLN biopsy, possible axillary dissection and so we are going to do that but will set it up after she sees Dr. Darnelle Catalan to make sure that doesn't change anything.     Past Medical History   Diagnosis  Date   .  Anemia     .  No pertinent past medical history  Past Surgical History   Procedure  Date   .  Cesarean section  1986, 1987   .  Neck surgery  2010       Family History   Problem  Relation  Age of Onset   .  Cancer  Mother         breast   .  Cancer  Maternal Aunt         breast, pancreatic   .  Cancer  Maternal Uncle         colon      Social History History   Substance Use Topics   .  Smoking status:  Former Smoker       Quit date:  06/17/1984   .  Smokeless tobacco:  Never Used   .  Alcohol Use:  Yes         weekends, events      No Known Allergies    No current outpatient prescriptions on file.      Review of Systems   Constitutional: Negative for fever, chills and unexpected weight change.  HENT: Negative for hearing loss, congestion, sore throat, trouble swallowing and voice change.   Eyes: Negative for visual disturbance.  Respiratory: Negative for cough and  wheezing.   Cardiovascular: Negative for chest pain, palpitations and leg swelling.  Gastrointestinal: Negative for nausea, vomiting, abdominal pain, diarrhea, constipation, blood in stool, abdominal distention and anal bleeding.  Genitourinary: Negative for hematuria, vaginal bleeding and difficulty urinating.  Musculoskeletal: Negative for arthralgias.  Skin: Negative for rash and wound.  Neurological: Negative for seizures, syncope and headaches.  Hematological: Negative for adenopathy. Does not bruise/bleed easily.  Psychiatric/Behavioral: Negative for confusion.    Blood pressure 140/90, pulse 68, temperature 98 F (36.7 C), temperature source Oral, resp. rate 18, height 5\' 8"  (1.727 m), weight 196 lb 6.4 oz (89.086 kg).   Physical Exam   Constitutional: She is oriented to person, place, and time. She appears well-developed and well-nourished. No distress.  HENT:   Head: Normocephalic and atraumatic.   Nose: Nose normal.   Mouth/Throat: No oropharyngeal exudate.  Eyes: Conjunctivae normal and EOM are normal. Pupils are equal, round, and reactive to light. Left eye exhibits no discharge. No scleral icterus.  Neck: Neck supple. No JVD present. No tracheal deviation present. No thyromegaly present.  Cardiovascular: Normal rate, regular rhythm, normal heart sounds and intact distal pulses.    No murmur heard. Pulmonary/Chest: Effort normal and breath sounds normal. No respiratory distress. She has no wheezes. She has no rales. She exhibits no tenderness.       Incision upper outer left breast healing without hematoma or infection. Tissues soft.  Abdominal: Soft. Bowel sounds are normal. She exhibits no distension and no mass. There is no tenderness. There is no rebound and no guarding.  Musculoskeletal: She exhibits no edema and no tenderness.  Lymphadenopathy:    She has no cervical adenopathy.  Neurological: She is alert and oriented to person, place, and time. She exhibits normal  muscle tone. Coordination normal.  Skin: Skin is warm. No rash noted. She is not diaphoretic. No erythema. No pallor.  Psychiatric: She has a normal mood and affect. Her behavior is normal. Judgment and thought content normal.    Data Reviewed I have reviewed her MRI and pathology report. I discussed her case with Dr. 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 ER 100%, PR  100%, HER-2-negative, Ki 67 16%.  T1c, Nx.   She has had an adequate lumpectomy with negative margins.   Positive family history of breast cancer in the mother and 2 second line relatives.   Plan Tentatively, we will schedule her for a left axillary sentinel lymph node biopsy, possible axilla lymph node dissection. I discussed the indications, details, techniques, and numerous risks of the surgery with her and her husband. She understands the issues. Al her questions are answered. She agrees with this plan.   She will have a medical oncology consultation with Dr. Darnelle Catalan on October 4. He will decide whether she should be referred to genetic counseling or not.       [name]. [name], M.D., [facility] General and Minimally invasive Surgery Breast and Colorectal Surgery Office:   [phone] Pager:   [phone]

## 2012-07-25 ENCOUNTER — Encounter (HOSPITAL_BASED_OUTPATIENT_CLINIC_OR_DEPARTMENT_OTHER): Payer: Self-pay

## 2012-07-25 ENCOUNTER — Encounter (HOSPITAL_BASED_OUTPATIENT_CLINIC_OR_DEPARTMENT_OTHER)
Admission: RE | Disposition: A | Discharge status: Home / Self Care | Payer: Self-pay | Source: Ambulatory Visit | Attending: General Surgery

## 2012-07-25 ENCOUNTER — Encounter (HOSPITAL_BASED_OUTPATIENT_CLINIC_OR_DEPARTMENT_OTHER): Payer: Self-pay | Admitting: Certified Registered Nurse Anesthetist

## 2012-07-25 ENCOUNTER — Encounter (HOSPITAL_BASED_OUTPATIENT_CLINIC_OR_DEPARTMENT_OTHER): Payer: Self-pay | Admitting: Anesthesiology

## 2012-07-25 ENCOUNTER — Ambulatory Visit (HOSPITAL_BASED_OUTPATIENT_CLINIC_OR_DEPARTMENT_OTHER)
Admission: RE | Admit: 2012-07-25 | Discharge: 2012-07-25 | Disposition: A | Discharge status: Home / Self Care | Payer: BC Managed Care – PPO | Source: Ambulatory Visit | Attending: General Surgery | Admitting: General Surgery

## 2012-07-25 ENCOUNTER — Encounter (HOSPITAL_COMMUNITY)
Admission: RE | Admit: 2012-07-25 | Discharge: 2012-07-25 | Disposition: A | Discharge status: Home / Self Care | Payer: BC Managed Care – PPO | Source: Ambulatory Visit | Attending: General Surgery | Admitting: General Surgery

## 2012-07-25 ENCOUNTER — Ambulatory Visit (HOSPITAL_BASED_OUTPATIENT_CLINIC_OR_DEPARTMENT_OTHER): Payer: BC Managed Care – PPO | Admitting: Certified Registered Nurse Anesthetist

## 2012-07-25 DIAGNOSIS — C50419 Malignant neoplasm of upper-outer quadrant of unspecified female breast: Secondary | ICD-10-CM | POA: Insufficient documentation

## 2012-07-25 DIAGNOSIS — D0590 Unspecified type of carcinoma in situ of unspecified breast: Secondary | ICD-10-CM

## 2012-07-25 DIAGNOSIS — C50412 Malignant neoplasm of upper-outer quadrant of left female breast: Secondary | ICD-10-CM | POA: Diagnosis present

## 2012-07-25 HISTORY — PX: AXILLARY LYMPH NODE BIOPSY: SHX5737

## 2012-07-25 SURGERY — AXILLARY LYMPH NODE BIOPSY
Anesthesia: General | Site: Axilla | Laterality: Left | Wound class: Clean

## 2012-07-25 MED ORDER — SODIUM CHLORIDE 0.9 % IV SOLN: 250.0000 mL | INTRAVENOUS | Status: DC | PRN

## 2012-07-25 MED ORDER — HYDROMORPHONE HCL PF 1 MG/ML IJ SOLN
0.2500 mg | INTRAMUSCULAR | Status: DC | PRN
  Administered 2012-07-25 (×4): 0.5 mg via INTRAVENOUS

## 2012-07-25 MED ORDER — ACETAMINOPHEN 650 MG RE SUPP: 650.0000 mg | RECTAL | Status: DC | PRN

## 2012-07-25 MED ORDER — OXYCODONE HCL 5 MG/5ML PO SOLN: 5.0000 mg | Freq: Once | ORAL | Status: DC | PRN

## 2012-07-25 MED ORDER — FENTANYL CITRATE 0.05 MG/ML IJ SOLN
50.0000 ug | INTRAMUSCULAR | Status: DC | PRN
  Administered 2012-07-25: 100 ug via INTRAVENOUS

## 2012-07-25 MED ORDER — ONDANSETRON HCL 4 MG/2ML IJ SOLN: 4.0000 mg | Freq: Once | INTRAMUSCULAR | Status: DC | PRN

## 2012-07-25 MED ORDER — MORPHINE SULFATE 2 MG/ML IJ SOLN: 2.0000 mg | INTRAMUSCULAR | Status: DC | PRN

## 2012-07-25 MED ORDER — DEXAMETHASONE SODIUM PHOSPHATE 4 MG/ML IJ SOLN
INTRAMUSCULAR | Status: DC | PRN
  Administered 2012-07-25: 10 mg via INTRAVENOUS

## 2012-07-25 MED ORDER — ACETAMINOPHEN 325 MG PO TABS: 650.0000 mg | ORAL_TABLET | ORAL | Status: DC | PRN

## 2012-07-25 MED ORDER — ONDANSETRON HCL 4 MG/2ML IJ SOLN
INTRAMUSCULAR | Status: DC | PRN
  Administered 2012-07-25: 4 mg via INTRAVENOUS

## 2012-07-25 MED ORDER — LIDOCAINE HCL (CARDIAC) 20 MG/ML IV SOLN
INTRAVENOUS | Status: DC | PRN
  Administered 2012-07-25: 60 mg via INTRAVENOUS

## 2012-07-25 MED ORDER — TECHNETIUM TC 99M SULFUR COLLOID FILTERED
1.0000 | Freq: Once | INTRAVENOUS | Status: AC | PRN
  Administered 2012-07-25: 1 via INTRADERMAL

## 2012-07-25 MED ORDER — ACETAMINOPHEN 10 MG/ML IV SOLN
INTRAVENOUS | Status: DC | PRN
  Administered 2012-07-25: 1000 mg via INTRAVENOUS

## 2012-07-25 MED ORDER — HEPARIN SODIUM (PORCINE) 5000 UNIT/ML IJ SOLN
5000.0000 [IU] | Freq: Once | INTRAMUSCULAR | Status: AC
  Administered 2012-07-25: 5000 [IU] via SUBCUTANEOUS

## 2012-07-25 MED ORDER — SODIUM CHLORIDE 0.9 % IV SOLN: INTRAVENOUS | Status: DC

## 2012-07-25 MED ORDER — OXYCODONE HCL 5 MG PO TABS: 5.0000 mg | ORAL_TABLET | Freq: Once | ORAL | Status: DC | PRN

## 2012-07-25 MED ORDER — MIDAZOLAM HCL 2 MG/2ML IJ SOLN
1.0000 mg | INTRAMUSCULAR | Status: DC | PRN
  Administered 2012-07-25: 2 mg via INTRAVENOUS

## 2012-07-25 MED ORDER — LACTATED RINGERS IV SOLN
INTRAVENOUS | Status: DC
  Administered 2012-07-25 (×2): via INTRAVENOUS

## 2012-07-25 MED ORDER — OXYCODONE HCL 5 MG PO TABS: 5.0000 mg | ORAL_TABLET | ORAL | Status: DC | PRN

## 2012-07-25 MED ORDER — CHLORHEXIDINE GLUCONATE 4 % EX LIQD: 1.0000 "application " | Freq: Once | CUTANEOUS | Status: DC

## 2012-07-25 MED ORDER — MIDAZOLAM HCL 5 MG/5ML IJ SOLN
INTRAMUSCULAR | Status: DC | PRN
  Administered 2012-07-25: 1 mg via INTRAVENOUS

## 2012-07-25 MED ORDER — BUPIVACAINE-EPINEPHRINE 0.5% -1:200000 IJ SOLN
INTRAMUSCULAR | Status: DC | PRN
  Administered 2012-07-25: 10 mL

## 2012-07-25 MED ORDER — SODIUM CHLORIDE 0.9 % IJ SOLN: 3.0000 mL | INTRAMUSCULAR | Status: DC | PRN

## 2012-07-25 MED ORDER — FENTANYL CITRATE 0.05 MG/ML IJ SOLN
INTRAMUSCULAR | Status: DC | PRN
  Administered 2012-07-25: 50 ug via INTRAVENOUS
  Administered 2012-07-25: 25 ug via INTRAVENOUS

## 2012-07-25 MED ORDER — ONDANSETRON HCL 4 MG/2ML IJ SOLN: 4.0000 mg | Freq: Four times a day (QID) | INTRAMUSCULAR | Status: DC | PRN

## 2012-07-25 MED ORDER — SODIUM CHLORIDE 0.9 % IJ SOLN: 3.0000 mL | Freq: Two times a day (BID) | INTRAMUSCULAR | Status: DC

## 2012-07-25 MED ORDER — PROPOFOL 10 MG/ML IV BOLUS
INTRAVENOUS | Status: DC | PRN
  Administered 2012-07-25: 200 mg via INTRAVENOUS

## 2012-07-25 MED ORDER — CEFAZOLIN SODIUM-DEXTROSE 2-3 GM-% IV SOLR
2.0000 g | INTRAVENOUS | Status: AC
  Administered 2012-07-25: 2 g via INTRAVENOUS

## 2012-07-25 SURGICAL SUPPLY — 58 items
ADH SKN CLS APL DERMABOND .7 (GAUZE/BANDAGES/DRESSINGS) ×2
APPLIER CLIP 11 MED OPEN (CLIP)
APR CLP MED 11 20 MLT OPN (CLIP)
BLADE HEX COATED 2.75 (ELECTRODE) ×3 IMPLANT
BLADE SURG 15 STRL LF DISP TIS (BLADE) ×2 IMPLANT
BLADE SURG 15 STRL SS (BLADE) ×3
CANISTER SUCTION 1200CC (MISCELLANEOUS) ×3 IMPLANT
CHLORAPREP W/TINT 26ML (MISCELLANEOUS) ×3 IMPLANT
CLIP APPLIE 11 MED OPEN (CLIP) IMPLANT
CLOTH BEACON ORANGE TIMEOUT ST (SAFETY) ×3 IMPLANT
COVER MAYO STAND STRL (DRAPES) ×3 IMPLANT
COVER PROBE W GEL 5X96 (DRAPES) ×2 IMPLANT
COVER TABLE BACK 60X90 (DRAPES) ×3 IMPLANT
DECANTER SPIKE VIAL GLASS SM (MISCELLANEOUS) ×1 IMPLANT
DERMABOND ADVANCED (GAUZE/BANDAGES/DRESSINGS) ×1
DERMABOND ADVANCED .7 DNX12 (GAUZE/BANDAGES/DRESSINGS) ×3 IMPLANT
DRAIN CHANNEL 19F RND (DRAIN) IMPLANT
DRAIN HEMOVAC 1/8 X 5 (WOUND CARE) IMPLANT
DRAPE LAPAROTOMY TRNSV 102X78 (DRAPE) ×3 IMPLANT
DRAPE UTILITY XL STRL (DRAPES) ×3 IMPLANT
ELECT REM PT RETURN 9FT ADLT (ELECTROSURGICAL) ×3
ELECTRODE REM PT RTRN 9FT ADLT (ELECTROSURGICAL) ×2 IMPLANT
EVACUATOR SILICONE 100CC (DRAIN) IMPLANT
GLOVE BIOGEL PI IND STRL 7.0 (GLOVE) ×1 IMPLANT
GLOVE BIOGEL PI INDICATOR 7.0 (GLOVE) ×1
GLOVE ECLIPSE 6.5 STRL STRAW (GLOVE) ×2 IMPLANT
GLOVE EUDERMIC 7 POWDERFREE (GLOVE) ×3 IMPLANT
GOWN PREVENTION PLUS XLARGE (GOWN DISPOSABLE) ×3 IMPLANT
GOWN PREVENTION PLUS XXLARGE (GOWN DISPOSABLE) ×3 IMPLANT
NDL HYPO 25X1 1.5 SAFETY (NEEDLE) ×1 IMPLANT
NEEDLE HYPO 22GX1.5 SAFETY (NEEDLE) IMPLANT
NEEDLE HYPO 25X1 1.5 SAFETY (NEEDLE) ×3 IMPLANT
NS IRRIG 1000ML POUR BTL (IV SOLUTION) ×3 IMPLANT
PACK BASIN DAY SURGERY FS (CUSTOM PROCEDURE TRAY) ×3 IMPLANT
PENCIL BUTTON HOLSTER BLD 10FT (ELECTRODE) ×3 IMPLANT
SHEET MEDIUM DRAPE 40X70 STRL (DRAPES) ×2 IMPLANT
SLEEVE SCD COMPRESS KNEE MED (MISCELLANEOUS) ×2 IMPLANT
SPONGE INTESTINAL PEANUT (DISPOSABLE) IMPLANT
SPONGE LAP 4X18 X RAY DECT (DISPOSABLE) ×3 IMPLANT
STAPLER VISISTAT 35W (STAPLE) ×1 IMPLANT
SUT ETHILON 3 0 PS 1 (SUTURE) IMPLANT
SUT MON AB 4-0 PC3 18 (SUTURE) ×3 IMPLANT
SUT SILK 3 0 SH 30 (SUTURE) IMPLANT
SUT VIC AB 2-0 SH 27 (SUTURE)
SUT VIC AB 2-0 SH 27XBRD (SUTURE) IMPLANT
SUT VIC AB 3-0 FS2 27 (SUTURE) IMPLANT
SUT VIC AB 3-0 SH 27 (SUTURE)
SUT VIC AB 3-0 SH 27X BRD (SUTURE) IMPLANT
SUT VICRYL 3-0 CR8 SH (SUTURE) ×2 IMPLANT
SUT VICRYL 4-0 PS2 18IN ABS (SUTURE) IMPLANT
SUT VICRYL AB 2 0 TIE (SUTURE) IMPLANT
SUT VICRYL AB 2 0 TIES (SUTURE)
SYR CONTROL 10ML LL (SYRINGE) ×3 IMPLANT
TOWEL OR 17X24 6PK STRL BLUE (TOWEL DISPOSABLE) ×6 IMPLANT
TOWEL OR NON WOVEN STRL DISP B (DISPOSABLE) ×3 IMPLANT
TUBE CONNECTING 20X1/4 (TUBING) ×3 IMPLANT
WATER STERILE IRR 1000ML POUR (IV SOLUTION) ×1 IMPLANT
YANKAUER SUCT BULB TIP NO VENT (SUCTIONS) ×3 IMPLANT

## 2012-07-25 NOTE — Transfer of Care (Signed)
Immediate Anesthesia Transfer of Care Note  Patient: Amber Santos  Procedure(s) Performed: Procedure(s) (LRB) with comments: AXILLARY LYMPH NODE BIOPSY (Left) - left axillary sentinel lymph node biopsy   Patient Location: PACU  Anesthesia Type: General  Level of Consciousness: awake, alert  and oriented  Airway & Oxygen Therapy: Patient Spontanous Breathing and Patient connected to face mask oxygen  Post-op Assessment: Report given to PACU RN and Post -op Vital signs reviewed and stable  Post vital signs: Reviewed and stable  Complications: No apparent anesthesia complications

## 2012-07-25 NOTE — Interval H&P Note (Signed)
History and Physical Interval Note:  07/25/2012 10:25 AM  Amber Santos  has presented today for surgery, with the diagnosis of Cancer left breast  The goals and the various methods of treatment have been discussed with the patient and family. After consideration of risks, benefits and other options for treatment, the patient has consented to  Procedure(s) (LRB) with comments: AXILLARY LYMPH NODE BIOPSY (Left) - left axillary sentinel lymph node biopsy possible axillary lymph node dissection AXILLARY LYMPH NODE DISSECTION (Left) as a surgical intervention .  The patient's history has been reviewed, patient examined, no change in status, stable for surgery.  I have reviewed the patient's chart and labs.  Questions were answered to the patient's satisfaction.     [name]

## 2012-07-25 NOTE — Anesthesia Preprocedure Evaluation (Signed)

## 2012-07-25 NOTE — Anesthesia Procedure Notes (Signed)
Procedure Name: LMA Insertion Date/Time: 07/25/2012 10:30 AM Performed by: Labrian Torregrossa D Pre-anesthesia Checklist: Patient identified, Emergency Drugs available, Suction available and Patient being monitored Patient Re-evaluated:Patient Re-evaluated prior to inductionOxygen Delivery Method: Circle System Utilized Preoxygenation: Pre-oxygenation with 100% oxygen Intubation Type: IV induction Ventilation: Mask ventilation without difficulty LMA: LMA inserted LMA Size: 4.0 Number of attempts: 1 Airway Equipment and Method: bite block Placement Confirmation: positive ETCO2 Tube secured with: Tape Dental Injury: Teeth and Oropharynx as per pre-operative assessment

## 2012-07-25 NOTE — Anesthesia Postprocedure Evaluation (Signed)
  Anesthesia Post-op Note  Patient: Amber Santos  Procedure(s) Performed: Procedure(s) (LRB) with comments: AXILLARY LYMPH NODE BIOPSY (Left) - left axillary sentinel lymph node biopsy   Patient Location: PACU  Anesthesia Type: General  Level of Consciousness: awake, alert  and oriented  Airway and Oxygen Therapy: Patient Spontanous Breathing and Patient connected to face mask oxygen  Post-op Pain: mild  Post-op Assessment: Post-op Vital signs reviewed  Post-op Vital Signs: Reviewed  Complications: No apparent anesthesia complications

## 2012-07-25 NOTE — Op Note (Signed)
Patient Name:           Amber Santos   Date of Surgery:        07/25/2012  Pre op Diagnosis:      Invasive ductal carcinoma left breast, upper outer quadrant with associated DCIS, pathologic stage T1c,Nx.    Post op Diagnosis:    same  Procedure:                 Inject blue dye left breast, left axillary sentinel node biopsy  Surgeon:                     [name]. [name], M.D., FACS  Assistant:                      none  Operative Indications:   Amber Santos is a 61 y.o. female. She was initially evaluated by radiology and underwent image guided biopsy which showed atypical ductal hyperplasia in the upper outer quadrant of the left breast.. On September 13 she underwent left partial mastectomy, upper outer quadrant with needle localization. Final pathology report showed invasive ductal carcinoma, 1.4 cm, with associated DCIS. There were one or 2 other small foci nearby which are DCIS. Margins are negative. Hormone receptor strongly positive, HER-2-negative, Ki 67 16%.  She went for MRI which shows surgical changes in the upper outer quadrant of the left breast but otherwise no other areas of enhancement in the breast and nodes looked negative.  Family history is positive for breast cancer in the mother, a maternal aunt and a paternal first cousin.  She has been seen by Dr. Darnelle Catalan and has had genetic counseling and blood drawn for genetic testing. She has done fairly well since the surgery.  I have counseled her and her husband extensively as an outpatient.. We talked about the fact that she probably has had an adequate lumpectomy for that she'll need sentinel lymph node biopsy. We discussed the fact that she might have to have an axillary dissection if the blue dye and and the radioactive tracer do not map to the nodes. We talked about the possibility of axillary dissection if she has 3 or more positive nodes. We also talked about the option of mastectomy and sentinel node biopsy, possible  axillary dissection. We talked about plastic surgical procedures and staged reconstruction and symmetry procedures in the event she chooses mastectomy.Marland Kitchen  She  wants to go ahead with the sentinel node biopsy, possible axillary dissection. She said that she would rather keep both breasts or have both breasts removed and that seemed simpler to her. I felt that was a reasonable attitude, but hopefully we can treat her with breast conservation surgery.  It was her desire to go ahead and schedule the SLN biopsy.   Operative Findings:  The nuclear medicine tracer and the blue dye both mapped nicely to the sentinel nodes. I found 3 sentinel nodes that had strong radioactivity and 2 of them had blue dye. The nodes did not appear pathologically enlarged       Procedure in Detail:          The patient was brought to [facility] center. Nuclear medicine technician injected the radionuclide into the left breast, subareolar area in the holding area. The patient was taken to operating room and underwent general anesthesia with LMA device. Surgical time out was performed. Intravenous antibiotics were given. Following alcohol prep I injected 5 cc of blue dye  into the subdermal area of the lateral left breast, lateral to the incision. This was 2 cc of methylene blue mixed with 3 cc of saline. The breast was massaged for 5 minutes. We then prepped and draped in the breast and axilla and in the usual sterile fashion. 0.5% Marcaine with epinephrine was used as a local infiltration anesthetic. A curved incision was made at the hairline of the left axilla. Using the neoprobe as a guide I dissected down through subcutaneous tissue and divided the clavipectoral fascia and entered the axillary space. I dissected out 3 sentinel lymph nodes. All 3 had strong radioactivity and 2 of them had blue dye in them. After this was done there was very little radioactivity left I did not palpate any abnormality. We sent 3 sentinel lymph nodes  for routine histology. The wound was irrigated with saline. Hemostasis was excellent and achieved with  electrocautery. I closed the clavipectoral fascia and subcutaneous tissue with interrupted sutures of 3-0 Vicryl. The skin was closed with a running subcuticular suture of 4-0 Monocryl and Dermabond. Patient to recovery was stable condition. EBL 10 cc. Counts correct. Complications none.     [name]. [name], M.D., FACS General and Minimally Invasive Surgery Breast and Colorectal Surgery  07/25/2012 11:18 AM

## 2012-07-27 ENCOUNTER — Other Ambulatory Visit: Payer: Self-pay | Admitting: Oncology

## 2012-07-27 ENCOUNTER — Telehealth (INDEPENDENT_AMBULATORY_CARE_PROVIDER_SITE_OTHER): Payer: Self-pay | Admitting: General Surgery

## 2012-07-27 NOTE — Telephone Encounter (Signed)
Called and left message for patient to return call. Tried to advise of pathology result per Dr. [name]. Will try again this afternoon or tomorrow if she does not call back.  Per [name] "tell her that her sentinel nodes are all negative and that is excellent news".

## 2012-07-27 NOTE — Progress Notes (Signed)
Quick Note:  Inform patient of Pathology report,.tell her that her sentinel nodes are all negative and that that is excellent news. ______

## 2012-07-28 ENCOUNTER — Telehealth (INDEPENDENT_AMBULATORY_CARE_PROVIDER_SITE_OTHER): Payer: Self-pay | Admitting: General Surgery

## 2012-07-28 NOTE — Telephone Encounter (Signed)
Called patient to advise of pathology results. Per Dr. [name] "her sentinel nodes are all negative and that is excellent news". Patient happy with results. Confirmed patient for follow up appointment on 08/15/14 at 8:15. 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 Patient stated the incision site is healing well, no signs of infection. Advised patient that is she is not able to get time off from work for the appointment to please call so it can be rescheduled. Patient agreed.

## 2012-08-01 ENCOUNTER — Encounter: Payer: Self-pay | Admitting: *Deleted

## 2012-08-01 NOTE — Progress Notes (Signed)
Dawn ordered Oncoptye Dx test w/ [facility] & faxed to path on 07/29/12.  Faxed PAC to [employer] this am.

## 2012-08-02 ENCOUNTER — Ambulatory Visit: Payer: BC Managed Care – PPO | Admitting: Oncology

## 2012-08-02 VITALS — BP 138/82 | HR 81 | Temp 98.7°F | Resp 20 | Ht 68.0 in | Wt 203.1 lb

## 2012-08-02 DIAGNOSIS — C50419 Malignant neoplasm of upper-outer quadrant of unspecified female breast: Secondary | ICD-10-CM

## 2012-08-02 NOTE — Progress Notes (Signed)
ID: Amber Santos   DOB: 1950/12/23  MR#: [id number]  CSN#:[id number]  PCP: Amber Boots, MD GYN:  SU: Amber Kelp MD OTHER MD: [place] Santos   HISTORY OF PRESENT ILLNESS: Amber Santos had routine screening mammography at [facility] 05/23/2022 showing new calcifications in the left breast. Diagnostic left mammography 05/27/2012 confirmed a cluster of faint calcifications in the upper outer quadrant. Biopsy was performed 06/01/2012, and showed atypical ductal hyperplasia ([id number]). Accordingly the patient underwent left lumpectomy 06/17/2012. This showed ([id number]) invasive ductal carcinoma, grade 2, measuring 1.4 cm, with associated high-grade ductal carcinoma in situ. There were 2 additional areas of ductal carcinoma in situ, but margins were clear. The invasive tumor was 100% estrogen receptor and 100% progesterone receptor positive, with an MIB-1 of 20%. It was HER-2 negative.   On 06/26/2012 the patient underwent bilateral breast MRI. This found a seroma in the upper outer quadrant of the left breast measuring 6 cm. There was no other area of suspicious enhancement in the left breast, no findings of concern in the right breast, and no axillary or internal mammary adenopathy. The patient's subsequent history is as detailed below  INTERVAL HISTORY: Amber Santos returns today with her husband [place] on 07/08/2012 for further discussion of her prognosis and treatment options. Since her last visit here she had her sentinel lymph node sampling. Thankfully this was negative  REVIEW OF SYSTEMS: She is having some pain from the surgery, but does not like to take any pain medicine. She has not started her stretching exercises yet. The day after surgery they travel to [place] for a daughter's birthday and it was a wonderful location. Aside from this a detailed review of systems today is entirely negative, and in particular she has had no fever, bleeding, or rash.  PAST MEDICAL HISTORY: Past  Medical History  Diagnosis Date  . Anemia   . Breast cancer     high grade ductal ca left breast    PAST SURGICAL HISTORY: Past Surgical History  Procedure Date  . Cesarean section 1986, 1987  . Anterior cervical decomp/discectomy fusion 01/10/2009    C4-7  . Breast lumpectomy 06/17/2012    left partial mastectomy    FAMILY HISTORY Family History  Problem Relation Age of Onset  . Breast cancer [facility] 53  . Cancer Maternal Aunt     breast, pancreatic  . Colon cancer Maternal Uncle   . Breast cancer Maternal Aunt 28  . Colon cancer Maternal Grandmother   . Kidney cancer Maternal Uncle   . Breast cancer Cousin     2 maternal cousins dx in 27s   the patient's father died at the age of 77 from an accident. The patient's Amber Santos is living, currently 61 years old. She has a history of breast cancer, postmenopausal. The patient has one brother, no sisters. In addition the patient has a maternal aunt who was diagnosed with breast cancer at age 22 and a cousin who was diagnosed with breast cancer at age 3. She'll says 2 maternal uncles with a history of colon cancer.  GYNECOLOGIC HISTORY: Menarche age 67, first live birth age 65, which of course increases the risk of breast cancer. She is GX P2. She stopped having periods  SOCIAL HISTORY: Amber Santos works as a Runner, broadcasting/film/video. Her husband                               . Son Amber Santos lives in [place]  and son Amber Santos lives in [place]. Both work for [place]   ADVANCED DIRECTIVES: in place  Sant[facility] MAINTENANCE: History  Substance Use Topics  . Smoking status: Former Smoker -- 0.5 packs/day for 12 years    Quit date: 06/17/1984  . Smokeless tobacco: Never Used  . Alcohol Use: Yes     weekends, events     Colonoscopy: 2005?  PAP: 2012  Bone density: 04/12/2008/ [facility]/ nl  Lipid panel:  No Known Allergies  No current outpatient prescriptions on file.    OBJECTIVE: Middle-aged white woman who appears well Filed Vitals:     08/02/12 1710  BP: 138/82  Pulse: 81  Temp: 98.7 F (37.1 C)  Resp: 20     Body mass index is 30.88 kg/(m^2).    ECOG FS: 0  Sclerae unicteric Oropharynx clear No cervical or supraclavicular adenopathy Lungs no rales or rhonchi Heart regular rate and rhythm Abd benign MSK no focal spinal tenderness, no peripheral edema Neuro: nonfocal Breasts: The right breast is unremarkable. The left breast is status post recent lumpectomy and sentinel lymph node sampling. The axillary scar is healing nicely. There is no significant erythema. There is some induration subjacent to the scar suggestive of some fluid accumulation. She has very limited range of motion of the left upper extremity at present chiefly because of pain.  LAB RESULTS: Lab Results  Component Value Date   WBC 3.4* 07/08/2012   NEUTROABS 1.6 07/08/2012   HGB 12.7 07/25/2012   HCT 37.9 07/08/2012   MCV 82.0 07/08/2012   PLT 264 07/08/2012      Chemistry      Component Value Date/Time   NA 139 07/08/2012 1052      Component Value Date/Time   CALCIUM 9.5 07/08/2012 1052       No results found for this basename: LABCA2    No components found with this basename: LABCA125    No results found for this basename: INR:1;PROTIME:1 in the last 168 hours  Urinalysis No results found for this basename: colorurine,  appearanceur,  labspec,  phurine,  glucoseu,  hgbur,  bilirubinur,  ketonesur,  proteinur,  urobilinogen,  nitrite,  leukocytesur    STUDIES: Mr Breast Bilateral W Wo Contrast  06/27/2012  *RADIOLOGY REPORT*  Clinical Data:  New diagnosis left sided breast cancer. The patient had a biopsy showing atypical ductal hyperplasia of which, on excision showed invasive ductal carcinoma and DCIS. Family history of breast cancer including [place] [facility], cousin and aunt.  BILATERAL BREAST MRI WITH AND WITHOUT CONTRAST  Technique: Multiplanar, multisequence MR images of both breasts were obtained prior to and following the intravenous  administration of 19ml of Multihance.  Three dimensional images were evaluated at the independent DynaCad workstation.  Comparison:  10/19/2005.  Findings: Foci of nonspecific enhancement seen bilaterally.  The left breast is smaller than the right with a thin, rim enhancing seroma in the upper-outer quadrant of the left breast posterior and middle third at the lumpectomy site measuring 3.8 x 2.7 x 6.0 cm. No suspicion enhancement seen in the left breast.  No suspicious enhancement or mass is seen in the right breast.  No axillary or internal mammary adenopathy is seen  IMPRESSION: Postsurgical changes seen in the upper-outer quadrant of the left breast without residual enhancement or mass.  No MRI specific evidence of malignancy, right breast.  RECOMMENDATION: Treatment plan, left breast.  THREE-DIMENSIONAL MR IMAGE RENDERING ON INDEPENDENT WORKSTATION:  Three-dimensional MR images were rendered by post-processing of  the original MR data on an independent workstation.  The three- dimensional MR images were interpreted, and findings were reported in the accompanying complete MRI report for this study.  BI-RADS CATEGORY 6:  Known biopsy-proven malignancy - appropriate action should be taken.   Original Report Authenticated By: Hiram Gash, M.D.    Mm Breast Surgical Specimen  06/17/2012  *RADIOLOGY REPORT*  Clinical Data:  Atypical ductal hyperplasia diagnosed following biopsy of left breast calcifications in August 2013.  NEEDLE LOCALIZATION WITH MAMMOGRAPHIC GUIDANCE AND SPECIMEN RADIOGRAPH  Comparison:  Previous exams.  Patient presents for needle localization prior to excisional biopsy.  I met with the patient and we discussed the procedure of needle localization including benefits and alternatives. We discussed the high likelihood of a successful procedure. We discussed the risks of the procedure, including infection, bleeding, tissue injury, and further surgery. Informed, written consent was given.   Using mammographic guidance, sterile technique, 2% lidocaine and a 9 cm modified Kopans needle, eight biopsy clip at the site of prior stereotactic biopsy was localized using a lateral to medial approach.  The films are marked for Dr. [name].  Specimen radiograph was performed at [facility], and confirms the biopsy clip, a few faint calcifications, and an intact wire present in the tissue sample.  The specimen is marked for pathology.  IMPRESSION: Needle localization of the left breast breast.  No apparent complications.   Original Report Authenticated By: Britta Mccreedy, M.D.    Mm Breast Wire Localization Left  06/17/2012  *RADIOLOGY REPORT*  Clinical Data:  Atypical ductal hyperplasia diagnosed following biopsy of left breast calcifications in August 2013.  NEEDLE LOCALIZATION WITH MAMMOGRAPHIC GUIDANCE AND SPECIMEN RADIOGRAPH  Comparison:  Previous exams.  Patient presents for needle localization prior to excisional biopsy.  I met with the patient and we discussed the procedure of needle localization including benefits and alternatives. We discussed the high likelihood of a successful procedure. We discussed the risks of the procedure, including infection, bleeding, tissue injury, and further surgery. Informed, written consent was given.  Using mammographic guidance, sterile technique, 2% lidocaine and a 9 cm modified Kopans needle, eight biopsy clip at the site of prior stereotactic biopsy was localized using a lateral to medial approach.  The films are marked for Dr. [name].  Specimen radiograph was performed at [facility], and confirms the biopsy clip, a few faint calcifications, and an intact wire present in the tissue sample.  The specimen is marked for pathology.  IMPRESSION: Needle localization of the left breast breast.  No apparent complications.   Original Report Authenticated By: Britta Mccreedy, M.D.     ASSESSMENT: 61 y.o. [place] woman status post left lumpectomy with no axillary lymph node  sampling 06/17/2012 for a pT1c cN0, clinical stage IA invasive ductal carcinoma, grade 2, 100% estrogen and 100% progesterone receptor positive, HER-2 negative, with an MIB-1 of 16%.  (1) left sentinel lymph node biopsy 07/25/2012 showed 0 of 3 lymph nodes positive  (2) Oncotype DX is pending  PLAN: Today I gave Canna the Adjuvant! online results, which suggest a 24% risk of recurrence within 10 years with local treatment only. Anti-estrogens would cut that risk in half and chemotherapy would reduce that risk by another 4%.  We had discussed the data previously. Now of course we know for a fact of the lymph nodes were negative. In addition the same program would predict her and 8% risk of death within 10 years with local treatment only, dropping to 6% with  hormones and 4% with hormones plus chemotherapy.  She is comfortable not receiving adjuvant chemotherapy for these very marginal benefits. However we do have to wait for the Oncotype results, and this should be ready by approximately November 11. My expectation is that this will fall in the intermediate "box", and in that case I would proceed with radiation, no chemotherapy, using the estimates just quoted. In fact the only reason I would consider chemotherapy in her would be if the Oncotype result came in the high risk "box". In that case we would do at least 4 cycles of cyclophosphamide and docetaxel.  She has a ready started simulation. She should most likely see Dr. Michell Heinrich the second week in November. She would has an appointment with Dr. [name] November 11 and he will likely refer her for rehabilitation of the left upper extremity at that time. Tentatively I am scheduling her to see me towards the end of radiation, but of course if the Oncotype DX score returned side we will have to meet before then.   Hubbert Landrigan C    08/02/2012

## 2012-08-03 ENCOUNTER — Encounter: Payer: Self-pay | Admitting: *Deleted

## 2012-08-03 NOTE — Progress Notes (Signed)
Re-faxed PAC to Cheryl at [facility]. 

## 2012-08-04 ENCOUNTER — Ambulatory Visit
Admission: RE | Admit: 2012-08-04 | Discharge: 2012-08-04 | Disposition: A | Discharge status: Home / Self Care | Payer: BC Managed Care – PPO | Source: Ambulatory Visit | Attending: Radiation Oncology | Admitting: Radiation Oncology

## 2012-08-04 ENCOUNTER — Other Ambulatory Visit: Payer: Self-pay | Admitting: Radiation Oncology

## 2012-08-04 DIAGNOSIS — C50419 Malignant neoplasm of upper-outer quadrant of unspecified female breast: Secondary | ICD-10-CM

## 2012-08-04 NOTE — Progress Notes (Signed)
Name: Amber Santos   MRN: [id number]  Date:  08/04/2012  DOB: 1951/07/03  Status:outpatient    DIAGNOSIS: Breast cancer.  CONSENT VERIFIED: yes   SET UP: Patient is setup supine   IMMOBILIZATION:  The following immobilization was used:Custom Moldable Pillow, breast board.   NARRATIVE: Ms. Eichhorn was brought to the CT Simulation planning suite.  Identity was confirmed.  All relevant records and images related to the planned course of therapy were reviewed.  Then, the patient was positioned in a stable reproducible clinical set-up for radiation therapy.  Wires were placed to delineate the clinical extent of breast tissue. A wire was placed on the scar as well.  CT images were obtained.  An isocenter was placed. Skin markings were placed.  The position of the heart was then analyzed.  Due to the proximity of the heart to the chest wall, I felt she would benefit from deep inspiration breath hold for cardiac sparing.  She was then coached and rescanned in the breath hold position.  Acceptable cardiac sparing was achieved. The CT images were loaded into the planning software where the target and avoidance structures were contoured.  The radiation prescription was entered and confirmed. The patient was discharged in stable condition and tolerated simulation well. We will plan on starting treatment on November 12th with her verification sim on the 11th (presumably her oncotype will be back by then)   TREATMENT PLANNING NOTE:  Treatment planning then occurred. I have requested : MLC's, isodose plan, basic dose calculation  3 Dimensional simulation was performed. I have requested and analyzed a dose volume histogram of the heart, lungs and cavity.

## 2012-08-08 ENCOUNTER — Encounter: Payer: Self-pay | Admitting: *Deleted

## 2012-08-08 NOTE — Progress Notes (Signed)
Mailed after appt letter to pt. 

## 2012-08-09 ENCOUNTER — Encounter: Payer: Self-pay | Admitting: *Deleted

## 2012-08-09 NOTE — Progress Notes (Signed)
Received Oncotype Dx results of 21.  Gave copy to MD.  Faxed copy to Sam in Rad Onc.  Took copy to Med Rec to scan.

## 2012-08-11 ENCOUNTER — Ambulatory Visit: Payer: BC Managed Care – PPO

## 2012-08-12 ENCOUNTER — Ambulatory Visit: Payer: BC Managed Care – PPO

## 2012-08-15 ENCOUNTER — Ambulatory Visit
Admission: RE | Admit: 2012-08-15 | Payer: BC Managed Care – PPO | Source: Ambulatory Visit | Admitting: Radiation Oncology

## 2012-08-15 ENCOUNTER — Encounter (INDEPENDENT_AMBULATORY_CARE_PROVIDER_SITE_OTHER): Payer: Self-pay | Admitting: General Surgery

## 2012-08-15 ENCOUNTER — Ambulatory Visit (INDEPENDENT_AMBULATORY_CARE_PROVIDER_SITE_OTHER): Payer: BC Managed Care – PPO | Admitting: General Surgery

## 2012-08-15 ENCOUNTER — Ambulatory Visit
Admission: RE | Admit: 2012-08-15 | Discharge: 2012-08-15 | Disposition: A | Discharge status: Home / Self Care | Payer: BC Managed Care – PPO | Source: Ambulatory Visit | Attending: Radiation Oncology | Admitting: Radiation Oncology

## 2012-08-15 ENCOUNTER — Ambulatory Visit: Payer: BC Managed Care – PPO | Admitting: Radiation Oncology

## 2012-08-15 ENCOUNTER — Ambulatory Visit: Payer: BC Managed Care – PPO

## 2012-08-15 ENCOUNTER — Encounter: Payer: Self-pay | Admitting: Radiation Oncology

## 2012-08-15 VITALS — BP 120/70 | HR 72 | Temp 97.3°F | Resp 18 | Ht 68.0 in | Wt 201.4 lb

## 2012-08-15 DIAGNOSIS — C50419 Malignant neoplasm of upper-outer quadrant of unspecified female breast: Secondary | ICD-10-CM

## 2012-08-15 NOTE — Patient Instructions (Signed)
Your breast exam shows that you are  healing uneventfully from the incisions in  the breast and the axilla without any complication. Breast symmetry is good and the wounds are healing without difficulty.  You may resume normal physical activities including sports in about a week.  I agree with proceeding with radiation therapy.  Dr. Darnelle Catalan will discuss antiestrogen therapy with you and will discuss the Oncotype DX score with you..  Since you are going to be seeing Dr. Darnelle Catalan periodically over the next 12 months, I will ask you to return to see me in September 2014.  You should get bilateral mammogram in August 2014.

## 2012-08-15 NOTE — Progress Notes (Addendum)
Patient ID: Amber Santos, female   DOB: Jan 30, 1951, 61 y.o.   MRN: [id number] History: This patient returns for postop visit following her sentinel node biopsy. She is initially was diagnosed with atypical ductal hyperplasia in the upper outer quadrant left breast. Subsequent left partial mastectomy on 06/17/2012 showed invasive ductal carcinoma, grade 2, 1.4 cm with associated high-grade DCIS. Margins were clear. ER/PR  were strongly positive. HER-2/neu negative. Subsequent bilateral breast MRI showed no other areas suspicious for malignancy. Subsequent left axillary sentinel node biopsy was successful in mapping and we found 3 sentinel nodes and  all 3 were negative. She has a T1C, N0 tumor. Reportedly the Oncotype DX  score is 21. She is planning to begin radiation therapy next week. She is anticipating starting on antiestrogen therapy. She does not think the Dr. Darnelle Catalan is going to give her chemotherapy. She has no complaints about her breast. Very comfortable. Brought to its well. No asymmetry  Exam: Patient looks well. In no distress. Husband is with her. Breasts are moderately large, very symmetrical, no deformity. Nipple projection normal. Incision upper outer quadrant left breast and left axilla had healed uneventfully.  Assessment: Invasive ductal carcinoma and high-grade DCIS left breast, upper outer quadrant, receptor positive, HER-2-negative, final pathologic stage T1c., N0, Oncotype DX score reportedly 21. Recovering uneventfully following partial mastectomy and sentinel node biopsy  Addendum(11/18/2012):   BRCA2 positive.     Plan: Proceed with radiation therapy Activities discussed. Followup Dr. Darnelle Catalan in the next few weeks to discuss other adjuvant therapies Since she will be seeing Dr. Darnelle Catalan frequently, I advised her to get mammograms in August 2014 and see me in September 2014. Sooner if there are any problems.   [name]. [name], M.D., [facility] General and Minimally invasive Surgery Breast and Colorectal Surgery Office:   [phone] Pager:   [phone]

## 2012-08-16 ENCOUNTER — Ambulatory Visit: Payer: BC Managed Care – PPO

## 2012-08-16 ENCOUNTER — Ambulatory Visit
Admission: RE | Admit: 2012-08-16 | Discharge: 2012-08-16 | Disposition: A | Discharge status: Home / Self Care | Payer: BC Managed Care – PPO | Source: Ambulatory Visit | Attending: Radiation Oncology | Admitting: Radiation Oncology

## 2012-08-16 ENCOUNTER — Ambulatory Visit: Admission: RE | Admit: 2012-08-16 | Payer: BC Managed Care – PPO | Source: Ambulatory Visit

## 2012-08-16 ENCOUNTER — Encounter: Payer: Self-pay | Admitting: Radiation Oncology

## 2012-08-16 VITALS — BP 112/72 | HR 61 | Resp 18 | Wt 201.0 lb

## 2012-08-16 DIAGNOSIS — C50419 Malignant neoplasm of upper-outer quadrant of unspecified female breast: Secondary | ICD-10-CM

## 2012-08-16 MED ORDER — RADIAPLEXRX EX GEL
Freq: Once | CUTANEOUS | Status: AC
  Administered 2012-08-16: 18:00:00 via TOPICAL

## 2012-08-16 MED ORDER — ALRA NON-METALLIC DEODORANT (RAD-ONC)
1.0000 "application " | Freq: Once | TOPICAL | Status: AC
  Administered 2012-08-16: 1 via TOPICAL

## 2012-08-16 NOTE — Progress Notes (Signed)
Received patient in the clinic today accompanied by her husband for a PUT with Dr. Michell Heinrich and post sim education with Sam, RN. Patient alert and oriented to person, place, and time. No distress noted. Steady gait noted. Pleasant affect noted. Patient denies pain at this time. Oriented patient to staff and routine of the clinic. Provided patient with RADIATION THERAPY AND YOU handbook and RADIATION THERAPY TO THE BREAST handout then, reviewed pertinent information. Educated patient on potential side effects and management such as fatigue and skin changes. Patient denies skin changes or fatigue at this time. Provided patient with Radiaplex and Alra then, directed upon use. Provided patient with appointment calender and this writer's contact information. All questions answered. Patient verbalized understanding of all reviewed.

## 2012-08-16 NOTE — Progress Notes (Signed)
Weekly Management Note Current Dose:  1.8 Gy  Projected Dose: 60.4 Gy   Narrative:  The patient presents for routine under treatment assessment.  CBCT/MVCT images/Port film x-rays were reviewed.  The chart was checked. Doing well after first treatment. Inquiring about Oncotype. Per her and her shusband he said no chemo if score was 30 or less. GSM though magintude of benefit per adjuvant was 4% per his last note. He will be back on Friday. RN education re: radiation performed.   Physical Findings: Weight: 201 lb (91.173 kg). Unchanged  Impression:  The patient is tolerating radiation.  Plan:  Continue treatment as planned. And her husband. I explained that I was not qualified to interpret her Oncotype recurrence score. We discussed that her recurrence rate with just radiation and surgery alone with tamoxifen and not chemotherapy was 14% based on this assay. They have a copy of her report and I discussed this with Dr. [name] our breast cancer navigator. We discussed that there really was no harm in continuing with radiation at this point given that Dr. Darnelle Catalan seemed to have a very low desire to give her chemotherapy. I offered to alter her treatments until she had a chance to speak with him. She preferred to continue on with treatment which I think is reasonable. Worse case in area she can stop radiation start chemotherapy and we can restart radiation after chemotherapy is complete.

## 2012-08-17 ENCOUNTER — Ambulatory Visit: Payer: BC Managed Care – PPO

## 2012-08-17 ENCOUNTER — Ambulatory Visit: Admission: RE | Admit: 2012-08-17 | Payer: BC Managed Care – PPO | Source: Ambulatory Visit

## 2012-08-17 ENCOUNTER — Ambulatory Visit
Admission: RE | Admit: 2012-08-17 | Discharge: 2012-08-17 | Disposition: A | Discharge status: Home / Self Care | Payer: BC Managed Care – PPO | Source: Ambulatory Visit | Attending: Radiation Oncology | Admitting: Radiation Oncology

## 2012-08-18 ENCOUNTER — Ambulatory Visit: Admission: RE | Admit: 2012-08-18 | Payer: BC Managed Care – PPO | Source: Ambulatory Visit

## 2012-08-18 ENCOUNTER — Ambulatory Visit: Payer: BC Managed Care – PPO

## 2012-08-18 ENCOUNTER — Ambulatory Visit
Admission: RE | Admit: 2012-08-18 | Discharge: 2012-08-18 | Disposition: A | Discharge status: Home / Self Care | Payer: BC Managed Care – PPO | Source: Ambulatory Visit | Attending: Radiation Oncology | Admitting: Radiation Oncology

## 2012-08-19 ENCOUNTER — Ambulatory Visit: Payer: BC Managed Care – PPO

## 2012-08-19 ENCOUNTER — Ambulatory Visit
Admission: RE | Admit: 2012-08-19 | Discharge: 2012-08-19 | Disposition: A | Discharge status: Home / Self Care | Payer: BC Managed Care – PPO | Source: Ambulatory Visit | Attending: Radiation Oncology | Admitting: Radiation Oncology

## 2012-08-19 ENCOUNTER — Ambulatory Visit: Admission: RE | Admit: 2012-08-19 | Payer: BC Managed Care – PPO | Source: Ambulatory Visit

## 2012-08-22 ENCOUNTER — Ambulatory Visit: Admission: RE | Admit: 2012-08-22 | Payer: BC Managed Care – PPO | Source: Ambulatory Visit

## 2012-08-22 ENCOUNTER — Ambulatory Visit
Admission: RE | Admit: 2012-08-22 | Discharge: 2012-08-22 | Disposition: A | Discharge status: Home / Self Care | Payer: BC Managed Care – PPO | Source: Ambulatory Visit | Attending: Radiation Oncology | Admitting: Radiation Oncology

## 2012-08-22 ENCOUNTER — Ambulatory Visit: Payer: BC Managed Care – PPO

## 2012-08-23 ENCOUNTER — Ambulatory Visit: Payer: BC Managed Care – PPO

## 2012-08-23 ENCOUNTER — Ambulatory Visit
Admission: RE | Admit: 2012-08-23 | Discharge: 2012-08-23 | Disposition: A | Discharge status: Home / Self Care | Payer: BC Managed Care – PPO | Source: Ambulatory Visit | Attending: Radiation Oncology | Admitting: Radiation Oncology

## 2012-08-23 ENCOUNTER — Encounter: Payer: Self-pay | Admitting: Radiation Oncology

## 2012-08-23 ENCOUNTER — Other Ambulatory Visit: Payer: Self-pay | Admitting: Oncology

## 2012-08-23 ENCOUNTER — Ambulatory Visit: Admission: RE | Admit: 2012-08-23 | Payer: BC Managed Care – PPO | Source: Ambulatory Visit

## 2012-08-23 VITALS — BP 130/76 | HR 70 | Temp 98.4°F | Resp 20 | Wt 201.7 lb

## 2012-08-23 DIAGNOSIS — C50419 Malignant neoplasm of upper-outer quadrant of unspecified female breast: Secondary | ICD-10-CM

## 2012-08-23 DIAGNOSIS — C50919 Malignant neoplasm of unspecified site of unspecified female breast: Secondary | ICD-10-CM

## 2012-08-23 NOTE — Progress Notes (Signed)
Weekly Management Note Current Dose:  10.8 Gy  Projected Dose: 45 Gy   Narrative:  The patient presents for routine under treatment assessment.  CBCT/MVCT images/Port film x-rays were reviewed.  The chart was checked. No complaints.   Physical Findings: Weight: 201 lb 11.2 oz (91.491 kg). Unchanged  Impression:  The patient is tolerating radiation.  Plan:  Continue treatment as planned. Increase radiaplex bid.

## 2012-08-23 NOTE — Progress Notes (Signed)
Pt denies pain, fatigue, loss of appetite. 

## 2012-08-23 NOTE — Progress Notes (Signed)
Called [facility] Santos to discuss her Oncotype results which I think are very favorable. The fall in the intermediate range. This is essentially noninformative, although the patient's a fell in this range in the original data set.almost no benefit from chemotherapy, just like the ones in the "low-risk" box.  We are going with the adjuvant! Prognostic dictation, which we discussed previously, and I am very comfortable with her not receiving chemotherapy. She will complete her radiation late December, and see me again in mid January at which time we will discuss anti-estrogens.

## 2012-08-24 ENCOUNTER — Ambulatory Visit: Payer: BC Managed Care – PPO

## 2012-08-24 ENCOUNTER — Telehealth: Payer: Self-pay | Admitting: Oncology

## 2012-08-24 ENCOUNTER — Ambulatory Visit: Admission: RE | Admit: 2012-08-24 | Payer: BC Managed Care – PPO | Source: Ambulatory Visit

## 2012-08-24 ENCOUNTER — Ambulatory Visit
Admission: RE | Admit: 2012-08-24 | Discharge: 2012-08-24 | Disposition: A | Discharge status: Home / Self Care | Payer: BC Managed Care – PPO | Source: Ambulatory Visit | Attending: Radiation Oncology | Admitting: Radiation Oncology

## 2012-08-24 NOTE — Telephone Encounter (Signed)
S/w the pt and she is aware of her jan 2014 appts °

## 2012-08-25 ENCOUNTER — Ambulatory Visit: Payer: BC Managed Care – PPO

## 2012-08-25 ENCOUNTER — Ambulatory Visit
Admission: RE | Admit: 2012-08-25 | Discharge: 2012-08-25 | Disposition: A | Discharge status: Home / Self Care | Payer: BC Managed Care – PPO | Source: Ambulatory Visit | Attending: Radiation Oncology | Admitting: Radiation Oncology

## 2012-08-26 ENCOUNTER — Ambulatory Visit
Admission: RE | Admit: 2012-08-26 | Discharge: 2012-08-26 | Disposition: A | Discharge status: Home / Self Care | Payer: BC Managed Care – PPO | Source: Ambulatory Visit | Attending: Radiation Oncology | Admitting: Radiation Oncology

## 2012-08-26 ENCOUNTER — Ambulatory Visit: Payer: BC Managed Care – PPO

## 2012-08-26 ENCOUNTER — Ambulatory Visit: Admission: RE | Admit: 2012-08-26 | Payer: BC Managed Care – PPO | Source: Ambulatory Visit

## 2012-08-27 ENCOUNTER — Ambulatory Visit
Admission: RE | Admit: 2012-08-27 | Discharge: 2012-08-27 | Disposition: A | Discharge status: Home / Self Care | Payer: BC Managed Care – PPO | Source: Ambulatory Visit | Attending: Radiation Oncology | Admitting: Radiation Oncology

## 2012-08-29 ENCOUNTER — Telehealth: Payer: Self-pay | Admitting: Oncology

## 2012-08-29 ENCOUNTER — Ambulatory Visit: Payer: BC Managed Care – PPO

## 2012-08-29 ENCOUNTER — Ambulatory Visit
Admission: RE | Admit: 2012-08-29 | Discharge: 2012-08-29 | Disposition: A | Discharge status: Home / Self Care | Payer: BC Managed Care – PPO | Source: Ambulatory Visit | Attending: Radiation Oncology | Admitting: Radiation Oncology

## 2012-08-29 ENCOUNTER — Ambulatory Visit: Admission: RE | Admit: 2012-08-29 | Payer: BC Managed Care – PPO | Source: Ambulatory Visit

## 2012-08-29 NOTE — Telephone Encounter (Signed)
The patient called on Friday August 12, 2012 to find out about her oncotype score- that came back at 89. I reported that to her and she became elated. She reported that Dr. Darnelle Catalan told her if her oncotype score was less than 30, she would not have to have chemo. 21 is in the intermediate range. I presumed that this score would necessitate a conversation with Dr. Darnelle Catalan. Dr. Darnelle Catalan is out of the office until next week, so I reviewed the case with Dr. Welton Flakes and she suggested a follow up with Dr. Darnelle Catalan upon his return. He does not have any availability, so I will discuss her results with him. I called Sam RN with Dr. Michell Heinrich and relayed this message so they were aware of this plan. Pt was thrilled.        This note was incorrectly entered into the wrong patients chart.

## 2012-08-30 ENCOUNTER — Encounter: Payer: Self-pay | Admitting: Radiation Oncology

## 2012-08-30 ENCOUNTER — Ambulatory Visit: Admission: RE | Admit: 2012-08-30 | Payer: BC Managed Care – PPO | Source: Ambulatory Visit

## 2012-08-30 ENCOUNTER — Ambulatory Visit
Admission: RE | Admit: 2012-08-30 | Discharge: 2012-08-30 | Disposition: A | Discharge status: Home / Self Care | Payer: BC Managed Care – PPO | Source: Ambulatory Visit | Attending: Radiation Oncology | Admitting: Radiation Oncology

## 2012-08-30 ENCOUNTER — Ambulatory Visit: Payer: BC Managed Care – PPO

## 2012-08-30 VITALS — BP 116/75 | HR 64 | Resp 18 | Wt 200.0 lb

## 2012-08-30 DIAGNOSIS — C50419 Malignant neoplasm of upper-outer quadrant of unspecified female breast: Secondary | ICD-10-CM

## 2012-08-30 NOTE — Progress Notes (Signed)
Patient presents to the clinic today for PUT with Dr. Michell Heinrich. Patient is alert and oriented to person, place, and time. No distress noted. Steady gait noted. Pleasant affect noted. Patient denies pain at this time. Patient denies skin changes of left/treated breast. Patient reports using Radiaplex and Alra as directed. Patient denies decline in energy level. Reported all findings to Dr. Michell Heinrich.

## 2012-08-30 NOTE — Progress Notes (Signed)
Weekly Management Note Current Dose: 19.8  Gy  Projected Dose:  Gy   Narrative:  The patient presents for routine under treatment assessment.  CBCT/MVCT images/Port film x-rays were reviewed.  The chart was checked. Doing well. Some soreness of entire breast.   Physical Findings: Weight: 200 lb (90.719 kg). Unchanged. No skin changes.   Impression:  The patient is tolerating radiation.  Plan:  Continue treatment as planned. Continue radiaplex.

## 2012-08-31 ENCOUNTER — Ambulatory Visit: Payer: BC Managed Care – PPO

## 2012-08-31 ENCOUNTER — Ambulatory Visit
Admission: RE | Admit: 2012-08-31 | Discharge: 2012-08-31 | Disposition: A | Discharge status: Home / Self Care | Payer: BC Managed Care – PPO | Source: Ambulatory Visit | Attending: Radiation Oncology | Admitting: Radiation Oncology

## 2012-08-31 ENCOUNTER — Ambulatory Visit: Admission: RE | Admit: 2012-08-31 | Payer: BC Managed Care – PPO | Source: Ambulatory Visit

## 2012-09-05 ENCOUNTER — Ambulatory Visit: Payer: BC Managed Care – PPO

## 2012-09-05 ENCOUNTER — Ambulatory Visit
Admission: RE | Admit: 2012-09-05 | Discharge: 2012-09-05 | Disposition: A | Discharge status: Home / Self Care | Payer: BC Managed Care – PPO | Source: Ambulatory Visit | Attending: Radiation Oncology | Admitting: Radiation Oncology

## 2012-09-05 ENCOUNTER — Ambulatory Visit: Admission: RE | Admit: 2012-09-05 | Payer: BC Managed Care – PPO | Source: Ambulatory Visit

## 2012-09-06 ENCOUNTER — Ambulatory Visit: Admission: RE | Admit: 2012-09-06 | Payer: BC Managed Care – PPO | Source: Ambulatory Visit

## 2012-09-06 ENCOUNTER — Ambulatory Visit: Payer: BC Managed Care – PPO

## 2012-09-06 ENCOUNTER — Ambulatory Visit
Admission: RE | Admit: 2012-09-06 | Discharge: 2012-09-06 | Disposition: A | Discharge status: Home / Self Care | Payer: BC Managed Care – PPO | Source: Ambulatory Visit | Attending: Radiation Oncology | Admitting: Radiation Oncology

## 2012-09-06 DIAGNOSIS — C50419 Malignant neoplasm of upper-outer quadrant of unspecified female breast: Secondary | ICD-10-CM

## 2012-09-06 NOTE — Progress Notes (Signed)
Weekly Management Note Current Dose:   Gy  Projected Dose:  Gy   Narrative:  The patient presents for routine under treatment assessment.  CBCT/MVCT images/Port film x-rays were reviewed.  The chart was checked. Soreness under breast. No breakdown.   Physical Findings: Weight:  . Unchanged.  Impression:  The patient is tolerating radiation.  Plan:  Continue treatment as planned. Non adherent dressings along inframammary fold.

## 2012-09-07 ENCOUNTER — Ambulatory Visit
Admission: RE | Admit: 2012-09-07 | Discharge: 2012-09-07 | Disposition: A | Discharge status: Home / Self Care | Payer: BC Managed Care – PPO | Source: Ambulatory Visit | Attending: Radiation Oncology | Admitting: Radiation Oncology

## 2012-09-07 ENCOUNTER — Ambulatory Visit: Payer: BC Managed Care – PPO

## 2012-09-07 ENCOUNTER — Ambulatory Visit: Admission: RE | Admit: 2012-09-07 | Payer: BC Managed Care – PPO | Source: Ambulatory Visit

## 2012-09-08 ENCOUNTER — Ambulatory Visit: Admission: RE | Admit: 2012-09-08 | Payer: BC Managed Care – PPO | Source: Ambulatory Visit

## 2012-09-08 ENCOUNTER — Ambulatory Visit: Payer: BC Managed Care – PPO

## 2012-09-08 ENCOUNTER — Ambulatory Visit
Admission: RE | Admit: 2012-09-08 | Discharge: 2012-09-08 | Disposition: A | Discharge status: Home / Self Care | Payer: BC Managed Care – PPO | Source: Ambulatory Visit | Attending: Radiation Oncology | Admitting: Radiation Oncology

## 2012-09-09 ENCOUNTER — Ambulatory Visit
Admission: RE | Admit: 2012-09-09 | Discharge: 2012-09-09 | Disposition: A | Discharge status: Home / Self Care | Payer: BC Managed Care – PPO | Source: Ambulatory Visit | Attending: Radiation Oncology | Admitting: Radiation Oncology

## 2012-09-09 ENCOUNTER — Ambulatory Visit: Admission: RE | Admit: 2012-09-09 | Payer: BC Managed Care – PPO | Source: Ambulatory Visit

## 2012-09-09 ENCOUNTER — Ambulatory Visit: Payer: BC Managed Care – PPO

## 2012-09-12 ENCOUNTER — Ambulatory Visit: Payer: BC Managed Care – PPO

## 2012-09-12 ENCOUNTER — Ambulatory Visit
Admission: RE | Admit: 2012-09-12 | Discharge: 2012-09-12 | Disposition: A | Discharge status: Home / Self Care | Payer: BC Managed Care – PPO | Source: Ambulatory Visit | Attending: Radiation Oncology | Admitting: Radiation Oncology

## 2012-09-12 ENCOUNTER — Ambulatory Visit: Admission: RE | Admit: 2012-09-12 | Payer: BC Managed Care – PPO | Source: Ambulatory Visit

## 2012-09-13 ENCOUNTER — Encounter: Payer: Self-pay | Admitting: Radiation Oncology

## 2012-09-13 ENCOUNTER — Ambulatory Visit
Admission: RE | Admit: 2012-09-13 | Discharge: 2012-09-13 | Disposition: A | Discharge status: Home / Self Care | Payer: BC Managed Care – PPO | Source: Ambulatory Visit | Attending: Radiation Oncology | Admitting: Radiation Oncology

## 2012-09-13 ENCOUNTER — Ambulatory Visit: Payer: BC Managed Care – PPO

## 2012-09-13 ENCOUNTER — Ambulatory Visit: Admission: RE | Admit: 2012-09-13 | Payer: BC Managed Care – PPO | Source: Ambulatory Visit

## 2012-09-13 VITALS — BP 119/76 | HR 69 | Resp 18 | Wt 200.0 lb

## 2012-09-13 DIAGNOSIS — C50419 Malignant neoplasm of upper-outer quadrant of unspecified female breast: Secondary | ICD-10-CM

## 2012-09-13 NOTE — Progress Notes (Signed)
Name: SHARIFAH CHAMPINE   MRN: [id number]  Date:  09/13/2012   DOB: 1951-07-31  Status:outpatient    DIAGNOSIS: Breast cancer.  CONSENT VERIFIED: yes   SET UP: Patient is setup supine   IMMOBILIZATION:  The following immobilization was used:Custom Moldable Pillow, breast board.   NARRATIVE: Marilynn Latino underwent complex simulation and treatment planning for her boost treatment today.  Her tumor volume was outlined on the planning CT scan.  Due to the depth of her cavity, electrons could not be used and a photon plan was developed. The plan will be prescribed to the 97  Isodose line.    3  fields will be used with  MLCs comprising   treatment devices.

## 2012-09-13 NOTE — Progress Notes (Signed)
Patient presents to the clinic today unaccompanied for PUT with Dr. Michell Heinrich. Patient alert and oriented to person, place, and time. No distress noted. Steady gait noted. Pleasant affect noted. Patient denies pain at this time. Hyperpigmentation of left breast with desquamation under the breast noted. Patient reports using neosporin with pain relief, cortisone, and radiaplex to affected breast. Patient reports breast pain is keeping her awake at night. Reported all findings to Dr. Michell Heinrich.

## 2012-09-13 NOTE — Progress Notes (Signed)
Weekly Management Note Current Dose: 36  Gy  Projected Dose: 45 Gy   Narrative:  The patient presents for routine under treatment assessment.  CBCT/MVCT images/Port film x-rays were reviewed.  The chart was checked. Doing well. Moist desquamation under breast. Using cortisone, lidocaine and neosporin plus pain. Rest of breast is asymptomatic.   Physical Findings: Weight: 200 lb (90.719 kg). Moist desquamation in inframammary folds. Some irritaiton lateral breast but intact.   Impression:  The patient is tolerating radiation.  Plan:  Continue treatment as planned. DC hydrocortisone and lidocaine in inframammary fold. Switch to just neosporin plus pain. Rest of breast switch to biafene.

## 2012-09-14 ENCOUNTER — Ambulatory Visit
Admission: RE | Admit: 2012-09-14 | Discharge: 2012-09-14 | Disposition: A | Discharge status: Home / Self Care | Payer: BC Managed Care – PPO | Source: Ambulatory Visit | Attending: Radiation Oncology | Admitting: Radiation Oncology

## 2012-09-14 ENCOUNTER — Ambulatory Visit: Payer: BC Managed Care – PPO

## 2012-09-14 ENCOUNTER — Ambulatory Visit: Admission: RE | Admit: 2012-09-14 | Payer: BC Managed Care – PPO | Source: Ambulatory Visit

## 2012-09-15 ENCOUNTER — Ambulatory Visit
Admission: RE | Admit: 2012-09-15 | Discharge: 2012-09-15 | Disposition: A | Discharge status: Home / Self Care | Payer: BC Managed Care – PPO | Source: Ambulatory Visit | Attending: Radiation Oncology | Admitting: Radiation Oncology

## 2012-09-15 ENCOUNTER — Ambulatory Visit: Payer: BC Managed Care – PPO

## 2012-09-15 ENCOUNTER — Ambulatory Visit: Admission: RE | Admit: 2012-09-15 | Payer: BC Managed Care – PPO | Source: Ambulatory Visit

## 2012-09-16 ENCOUNTER — Ambulatory Visit: Payer: BC Managed Care – PPO

## 2012-09-16 ENCOUNTER — Ambulatory Visit
Admission: RE | Admit: 2012-09-16 | Discharge: 2012-09-16 | Disposition: A | Discharge status: Home / Self Care | Payer: BC Managed Care – PPO | Source: Ambulatory Visit | Attending: Radiation Oncology | Admitting: Radiation Oncology

## 2012-09-16 ENCOUNTER — Ambulatory Visit
Admission: RE | Admit: 2012-09-16 | Discharge: 2012-09-16 | Payer: BC Managed Care – PPO | Source: Ambulatory Visit | Attending: Radiation Oncology | Admitting: Radiation Oncology

## 2012-09-16 DIAGNOSIS — C50419 Malignant neoplasm of upper-outer quadrant of unspecified female breast: Secondary | ICD-10-CM

## 2012-09-16 MED ORDER — BIAFINE EX EMUL
Freq: Once | CUTANEOUS | Status: AC
Start: 2012-09-16 — End: 2012-09-16
  Administered 2012-09-16: 11:00:00 via TOPICAL

## 2012-09-19 ENCOUNTER — Ambulatory Visit: Payer: BC Managed Care – PPO

## 2012-09-19 ENCOUNTER — Ambulatory Visit
Admission: RE | Admit: 2012-09-19 | Discharge: 2012-09-19 | Disposition: A | Discharge status: Home / Self Care | Payer: BC Managed Care – PPO | Source: Ambulatory Visit | Attending: Radiation Oncology | Admitting: Radiation Oncology

## 2012-09-19 ENCOUNTER — Ambulatory Visit: Admission: RE | Admit: 2012-09-19 | Payer: BC Managed Care – PPO | Source: Ambulatory Visit

## 2012-09-19 ENCOUNTER — Encounter: Payer: Self-pay | Admitting: Radiation Oncology

## 2012-09-19 VITALS — BP 119/76 | HR 70 | Resp 18 | Wt 200.0 lb

## 2012-09-19 DIAGNOSIS — C50419 Malignant neoplasm of upper-outer quadrant of unspecified female breast: Secondary | ICD-10-CM

## 2012-09-19 MED ORDER — SILVER SULFADIAZINE 1 % EX CREA: TOPICAL_CREAM | Freq: Two times a day (BID) | CUTANEOUS | Status: DC

## 2012-09-19 MED ORDER — SILVER SULFADIAZINE 1 % EX CREA
TOPICAL_CREAM | Freq: Once | CUTANEOUS | Status: AC
  Administered 2012-09-19: 18:00:00 via TOPICAL

## 2012-09-19 NOTE — Progress Notes (Signed)
Patient returned to the clinic following treatment. Apply thin layer of silvadene to area of desquamation as directed by Dr. Basilio Cairo. Educated patient on application. Patient verbalized understanding. Instructed patient to present early tomorrow for treatment to be evaluated prior to treatment by Dr. Michell Heinrich. Patient states, "I feel like such a wimp." Normalized patient's feelings and reassured her.

## 2012-09-19 NOTE — Progress Notes (Signed)
Patient presented to the clinic today prior to treatment requesting to be seen by a physician. Patient accompanied by her husband. Patient alert and oriented to person, place, and time. No distress noted. Steady gait noted. Pleasant affect noted. Patient reports left breast pain 10 on a scale of 0-10. Hyperpigmentation of left breast with moist desquamation under left breast noted. Patient tearful. Patient reports using biafine cream and neosporin as directed. Patient denies having or taking any pain medication for this pain. Reported all findings to Dr. Basilio Cairo.

## 2012-09-19 NOTE — Addendum Note (Signed)
Encounter addended by: Agnes Lawrence, RN on: 09/19/2012  6:00 PM<BR>     Documentation filed: Notes Section, Inpatient MAR, Orders

## 2012-09-19 NOTE — Progress Notes (Signed)
   Weekly Management Note:  Outpatient Current Dose:  41.4 Gy  Projected Dose: 45 Gy  + boost  Narrative:  The patient presents for routine under treatment assessment.  CBCT/MVCT images/Port film x-rays were reviewed.  The chart was checked. She is tearful today, worsening skin reaction, particularly painful at the left inframammary fold.  She has been using lidocaine in this area over the past couple days and this does make it feel better (however I note from prior documentation by Dr. Michell Heinrich that lidocaine was discharged). She is also using Neosporin and non-adherent dressing. She reports that her pain is 10 out of 10, but acknowledges that her emotional exhaustion from treatment may be contributing to her perception of pain. 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  Physical Findings:  weight is 200 lb (90.719 kg). Her blood pressure is 119/76 and her pulse is 70. Her respiration is 18.  she has very large breasts. The left breast is diffusely hyperpigmented and erythematous. There is a large patch of moist  desquamation in the inframammary fold.  Impression:  The patient is tolerating radiotherapy.  Plan:  Empathized with patient. Continue radiotherapy as planned. I recommended changing to Silvadene cream - we will initially apply a small patch to make sure she does not have an adverse reaction to it. If she tolerates it, she will apply to the whole inframammary fold tomorrow morning. Stop lidocaine. She'll see Dr. Michell Heinrich tomorrow before treatment. She declines narcotics at this time.  ________________________________   Lonie Peak, M.D.

## 2012-09-20 ENCOUNTER — Ambulatory Visit: Payer: BC Managed Care – PPO

## 2012-09-20 ENCOUNTER — Ambulatory Visit
Admission: RE | Admit: 2012-09-20 | Discharge: 2012-09-20 | Disposition: A | Discharge status: Home / Self Care | Payer: BC Managed Care – PPO | Source: Ambulatory Visit | Attending: Radiation Oncology | Admitting: Radiation Oncology

## 2012-09-20 ENCOUNTER — Ambulatory Visit: Admission: RE | Admit: 2012-09-20 | Payer: BC Managed Care – PPO | Source: Ambulatory Visit

## 2012-09-20 DIAGNOSIS — C50419 Malignant neoplasm of upper-outer quadrant of unspecified female breast: Secondary | ICD-10-CM

## 2012-09-20 NOTE — Progress Notes (Signed)
Weekly Management Note Current Dose:45   Gy  Projected Dose: 45 Gy   Narrative:  The patient presents for routine under treatment assessment.  CBCT/MVCT images/Port film x-rays were reviewed.  The chart was checked. Silvadene is helping.  She is still sad/angry/would like to stop but knows she can't.  Not tired. Silvadene is helping but underneath breast is still open and painful.  Physical Findings: Moist desquamation under left breast. Rest of breast is pink.   Impression:  The patient is tolerating radiation.  Plan:  Continue treatment as planned. Continue silvadene and radiaplex.

## 2012-09-21 ENCOUNTER — Encounter: Payer: Self-pay | Admitting: Radiation Oncology

## 2012-09-21 ENCOUNTER — Ambulatory Visit
Admission: RE | Admit: 2012-09-21 | Discharge: 2012-09-21 | Disposition: A | Discharge status: Home / Self Care | Payer: BC Managed Care – PPO | Source: Ambulatory Visit | Attending: Radiation Oncology | Admitting: Radiation Oncology

## 2012-09-21 ENCOUNTER — Ambulatory Visit: Payer: BC Managed Care – PPO

## 2012-09-22 ENCOUNTER — Ambulatory Visit: Admission: RE | Admit: 2012-09-22 | Payer: BC Managed Care – PPO | Source: Ambulatory Visit

## 2012-09-22 ENCOUNTER — Ambulatory Visit
Admission: RE | Admit: 2012-09-22 | Discharge: 2012-09-22 | Disposition: A | Discharge status: Home / Self Care | Payer: BC Managed Care – PPO | Source: Ambulatory Visit | Attending: Radiation Oncology | Admitting: Radiation Oncology

## 2012-09-22 ENCOUNTER — Ambulatory Visit: Payer: BC Managed Care – PPO

## 2012-09-23 ENCOUNTER — Ambulatory Visit: Payer: BC Managed Care – PPO

## 2012-09-23 ENCOUNTER — Ambulatory Visit: Admission: RE | Admit: 2012-09-23 | Payer: BC Managed Care – PPO | Source: Ambulatory Visit

## 2012-09-23 ENCOUNTER — Ambulatory Visit
Admission: RE | Admit: 2012-09-23 | Discharge: 2012-09-23 | Disposition: A | Discharge status: Home / Self Care | Payer: BC Managed Care – PPO | Source: Ambulatory Visit | Attending: Radiation Oncology | Admitting: Radiation Oncology

## 2012-09-26 ENCOUNTER — Ambulatory Visit
Admission: RE | Admit: 2012-09-26 | Discharge: 2012-09-26 | Disposition: A | Discharge status: Home / Self Care | Payer: BC Managed Care – PPO | Source: Ambulatory Visit | Attending: Radiation Oncology | Admitting: Radiation Oncology

## 2012-09-26 ENCOUNTER — Ambulatory Visit: Payer: BC Managed Care – PPO

## 2012-09-26 ENCOUNTER — Ambulatory Visit: Admission: RE | Admit: 2012-09-26 | Payer: BC Managed Care – PPO | Source: Ambulatory Visit

## 2012-09-27 ENCOUNTER — Ambulatory Visit: Admission: RE | Admit: 2012-09-27 | Payer: BC Managed Care – PPO | Source: Ambulatory Visit

## 2012-09-27 ENCOUNTER — Ambulatory Visit: Payer: BC Managed Care – PPO

## 2012-09-27 ENCOUNTER — Ambulatory Visit
Admission: RE | Admit: 2012-09-27 | Discharge: 2012-09-27 | Disposition: A | Discharge status: Home / Self Care | Payer: BC Managed Care – PPO | Source: Ambulatory Visit | Attending: Radiation Oncology | Admitting: Radiation Oncology

## 2012-09-29 ENCOUNTER — Ambulatory Visit: Payer: BC Managed Care – PPO

## 2012-09-29 ENCOUNTER — Ambulatory Visit
Admission: RE | Admit: 2012-09-29 | Discharge: 2012-09-29 | Disposition: A | Discharge status: Home / Self Care | Payer: BC Managed Care – PPO | Source: Ambulatory Visit | Attending: Radiation Oncology | Admitting: Radiation Oncology

## 2012-09-30 ENCOUNTER — Ambulatory Visit: Payer: BC Managed Care – PPO

## 2012-09-30 ENCOUNTER — Ambulatory Visit
Admission: RE | Admit: 2012-09-30 | Discharge: 2012-09-30 | Disposition: A | Discharge status: Home / Self Care | Payer: BC Managed Care – PPO | Source: Ambulatory Visit | Attending: Radiation Oncology | Admitting: Radiation Oncology

## 2012-09-30 ENCOUNTER — Telehealth: Payer: Self-pay | Admitting: Radiation Oncology

## 2012-09-30 NOTE — Progress Notes (Signed)
Patient refused to be seen by female physician. Will arrange an appointment for patient to be seen by Dr. Michell Heinrich on Monday or Tuesday of the coming week. Informed patient this writer would call her with appointment. Patient verbalized understanding.

## 2012-09-30 NOTE — Telephone Encounter (Signed)
Patient no show for 1000 treatment. This behavior unusual for patient. Phoned to check status. Patient confused appointment time. Patient reports she can be present for treatment by 1100. Instructed patient this was fine after confirming with Amber Santos, RT.

## 2012-10-03 ENCOUNTER — Ambulatory Visit: Payer: BC Managed Care – PPO

## 2012-10-03 ENCOUNTER — Ambulatory Visit
Admission: RE | Admit: 2012-10-03 | Discharge: 2012-10-03 | Disposition: A | Discharge status: Home / Self Care | Payer: BC Managed Care – PPO | Source: Ambulatory Visit | Attending: Radiation Oncology | Admitting: Radiation Oncology

## 2012-10-03 ENCOUNTER — Encounter: Payer: Self-pay | Admitting: Radiation Oncology

## 2012-10-03 VITALS — BP 117/73 | HR 69 | Resp 18 | Wt 200.0 lb

## 2012-10-03 DIAGNOSIS — C50419 Malignant neoplasm of upper-outer quadrant of unspecified female breast: Secondary | ICD-10-CM

## 2012-10-03 NOTE — Progress Notes (Signed)
Weekly Management Note Current Dose:  60.4 Gy  Projected Dose: 60.4 Gy   Narrative:  The patient presents for routine under treatment assessment.  CBCT/MVCT images/Port film x-rays were reviewed.  The chart was checked. Completed RT today. Skin healing well. Less pain. Still moist in inframammary fold.   Physical Findings: Weight: 200 lb (90.719 kg). Dark skin. No moist desquamation. Some dry desquamatino in axilla.  Impression:  The patient finishes RT today.  Plan:  F/u in 1 month. Lotion with vitamin e after 2 weeks. Refer to fynn.

## 2012-10-03 NOTE — Progress Notes (Signed)
Patient presented to the clinic today for PUT with Dr. Michell Heinrich following final treatment. Patient alert and oriented to person, place, and time. No distress noted. Steady gait noted. Pleasant affect noted. Patient denies pain at this time. Left breast with hyperpigmentation and dry desquamation under axilla. Encouraged patient to continue to use Radiaplex for the next two weeks twice per day. Patient reports that this morning she walked three miles and her energy level is improving. Provided patient with a few non adherent dressings to use as a board between her bra and skin of her left/treated breast. Provided patient with [facility] and [facility] flyer then, reviewed pertinent information. Encouraged patient to phone staff with future needs. Patient verbalized understanding of all reviewed. Reported all findings to Dr. Michell Heinrich.

## 2012-10-04 ENCOUNTER — Ambulatory Visit: Payer: BC Managed Care – PPO

## 2012-10-06 ENCOUNTER — Ambulatory Visit: Payer: BC Managed Care – PPO

## 2012-10-06 ENCOUNTER — Ambulatory Visit: Admission: RE | Admit: 2012-10-06 | Payer: BC Managed Care – PPO | Source: Ambulatory Visit

## 2012-10-07 ENCOUNTER — Ambulatory Visit: Payer: BC Managed Care – PPO

## 2012-10-07 NOTE — Progress Notes (Signed)
  Radiation Oncology         [phone] ________________________________  Name: Amber Santos MRN: [id number]  Date: 09/21/2012  DOB: August 19, 1951  Simulation Verification Note  Status:  outpatient  NARRATIVE: The patient was brought to the treatment unit and placed in the planned treatment position. The clinical setup was verified. Then port films were obtained and uploaded to the radiation oncology medical record software.  The treatment beams were carefully compared against the planned radiation fields. The position location and shape of the radiation fields was reviewed. The targeted volume of tissue appears appropriately covered by the radiation beams. Organs at risk appear to be excluded as planned.  Based on my personal review, I approved the simulation verification. The patient's treatment will proceed as planned.  ------------------------------------------------  Lurline Hare, MD

## 2012-10-07 NOTE — Progress Notes (Addendum)
°  Radiation Oncology         [phone] ________________________________  Name: Amber Santos MRN: [id number]  Date: 10/03/2012  DOB: [birth year]  End of Treatment Note  Diagnosis:   T1cN0 Invasive Ductal Carcinoma of the left breast  Indication for treatment:  Curative       Radiation treatment dates:   08/16/2012-10/03/2012   Site/dose:   Left breast  / 45 Gy @ 1.8 Gy per fraction x 25 fractions Left breast boost / 16 Gy @ 2 Gy per fraction x 8 fractions  Beams/energy:  Opposed tangents with reduced fields / 6 MV photons Three field/ photons  Narrative: The patient tolerated radiation treatment relatively well.   She had moist desquamation in the inframammary fold at the end of treatment and struggled with dry desquamation in the axilla and medial aspect of the breast.   Plan: The patient has completed radiation treatment. The patient will return to radiation oncology clinic for follow up in 2 weeks for a skin check.  She will also follow up with medical oncology. I advised them to call or return sooner if they have any questions or concerns related to their recovery or treatment.  ------------------------------------------------  Lurline Hare, MD

## 2012-10-07 NOTE — Progress Notes (Signed)
  Radiation Oncology         [phone] ________________________________  Name: Amber Santos MRN: [id number]  Date: 08/15/2012  DOB: [birth year]  Simulation Verification Note  Status: outpatient  NARRATIVE: The patient was brought to the treatment unit and placed in the planned treatment position. The clinical setup was verified. Then port films were obtained and uploaded to the radiation oncology medical record software.  The treatment beams were carefully compared against the planned radiation fields. The position location and shape of the radiation fields was reviewed. The targeted volume of tissue appears appropriately covered by the radiation beams. Organs at risk appear to be excluded as planned.  Based on my personal review, I approved the simulation verification. The patient's treatment will proceed as planned.  ------------------------------------------------  Lurline Hare, MD

## 2012-10-10 ENCOUNTER — Ambulatory Visit: Payer: BC Managed Care – PPO

## 2012-10-11 ENCOUNTER — Ambulatory Visit: Payer: BC Managed Care – PPO

## 2012-10-12 ENCOUNTER — Ambulatory Visit: Payer: BC Managed Care – PPO

## 2012-10-13 ENCOUNTER — Ambulatory Visit: Payer: BC Managed Care – PPO

## 2012-10-14 ENCOUNTER — Ambulatory Visit: Payer: BC Managed Care – PPO

## 2012-10-20 ENCOUNTER — Other Ambulatory Visit (HOSPITAL_BASED_OUTPATIENT_CLINIC_OR_DEPARTMENT_OTHER): Payer: BC Managed Care – PPO | Admitting: Lab

## 2012-10-20 ENCOUNTER — Ambulatory Visit (HOSPITAL_BASED_OUTPATIENT_CLINIC_OR_DEPARTMENT_OTHER): Payer: BC Managed Care – PPO | Admitting: Oncology

## 2012-10-20 VITALS — BP 120/80 | HR 75 | Temp 98.6°F | Resp 20 | Ht 68.0 in | Wt 202.2 lb

## 2012-10-20 DIAGNOSIS — Z170 Estrogen receptor positive status [ER+]: Secondary | ICD-10-CM

## 2012-10-20 DIAGNOSIS — C50419 Malignant neoplasm of upper-outer quadrant of unspecified female breast: Secondary | ICD-10-CM

## 2012-10-20 DIAGNOSIS — C50919 Malignant neoplasm of unspecified site of unspecified female breast: Secondary | ICD-10-CM

## 2012-10-20 LAB — CBC WITH DIFFERENTIAL/PLATELET
BASO%: 0.4 % (ref 0.0–2.0)
EOS%: 3.4 % (ref 0.0–7.0)
HCT: 35.9 % (ref 34.8–46.6)
LYMPH%: 22.7 % (ref 14.0–49.7)
MCH: 26.7 pg (ref 25.1–34.0)
MCHC: 33 g/dL (ref 31.5–36.0)
NEUT%: 55.5 % (ref 38.4–76.8)
Platelets: 221 10*3/uL (ref 145–400)
RBC: 4.43 10*6/uL (ref 3.70–5.45)

## 2012-10-20 MED ORDER — TAMOXIFEN CITRATE 20 MG PO TABS: 20.0000 mg | ORAL_TABLET | Freq: Every day | ORAL | Status: DC

## 2012-10-20 NOTE — Progress Notes (Signed)
ID: Amber Santos   DOB: [birth year]  MR#: [id number]  CSN#:[id number]  PCP: Annamaria Boots, MD GYN:  SU: Claud Kelp MD OTHER MD: Hal Neer   HISTORY OF PRESENT ILLNESS: Amber Santos had routine screening mammography at [facility] 05/23/2022 showing new calcifications in the left breast. Diagnostic left mammography 05/27/2012 confirmed a cluster of faint calcifications in the upper outer quadrant. Biopsy was performed 06/01/2012, and showed atypical ductal hyperplasia ([id number]). Accordingly the patient underwent left lumpectomy 06/17/2012. This showed ([id number]) invasive ductal carcinoma, grade 2, measuring 1.4 cm, with associated high-grade ductal carcinoma in situ. There were 2 additional areas of ductal carcinoma in situ, but margins were clear. The invasive tumor was 100% estrogen receptor and 100% progesterone receptor positive, with an MIB-1 of 20%. It was HER-2 negative.   On 06/26/2012 the patient underwent bilateral breast MRI. This found a seroma in the upper outer quadrant of the left breast measuring 6 cm. There was no other area of suspicious enhancement in the left breast, no findings of concern in the right breast, and no axillary or internal mammary adenopathy. The patient's subsequent history is as detailed below  INTERVAL HISTORY: Amber Santos returns today with her husband Jesusita Oka for followup of her breast cancer. Since her last visit here she completed her radiation treatments. She is now ready to start antiestrogen therapy.  REVIEW OF SYSTEMS: She had "too bad days" during radiation, with a significant amount of wet desquamation. She found the Biafine and radio plexus creams not Berry helpful. Silvadene was better. That'll problem has largely resolved. She did have a little bit of fatigue but continued to work full-time right through the radiation treatments. She describes her energy now is normal. Also the pain she was having in the breast is gone a detailed review  of systems is otherwise noncontributory.Marland Kitchen  PAST MEDICAL HISTORY: Past Medical History  Diagnosis Date  . Anemia   . Breast cancer     high grade ductal ca left breast    PAST SURGICAL HISTORY: Past Surgical History  Procedure Date  . Cesarean section 1986, 1987  . Anterior cervical decomp/discectomy fusion 01/10/2009    C4-7  . Breast lumpectomy 06/17/2012    left partial mastectomy  . Axillary lymph node biopsy 07/25/12    left    FAMILY HISTORY Family History  Problem Relation Age of Onset  . Breast cancer Mother 60  . Cancer Maternal Aunt     breast, pancreatic  . Colon cancer Maternal Uncle   . Breast cancer Maternal Aunt 60  . Colon cancer Maternal Grandmother   . Kidney cancer Maternal Uncle   . Breast cancer Cousin     2 maternal cousins dx in 60s   the patient's father died at the age of 49 from an accident. The patient's mother is living, currently 60 years old. She has a history of breast cancer, postmenopausal. The patient has one brother, no sisters. In addition the patient has a maternal aunt who was diagnosed with breast cancer at age 60 and a cousin who was diagnosed with breast cancer at age 60 She'll says 2 maternal uncles with a history of colon cancer.  GYNECOLOGIC HISTORY: Menarche age 62, first live birth age 62, which of course increases the risk of breast cancer. She is GX P2. She stopped having periods  SOCIAL HISTORY: Amber Santos works as a Runner, broadcasting/film/video. Her husband Amber Santos is V.P. at [employer]. Son Amber Santos lives in [place] and son Amber Santos lives in [place].  Both work for [employer]   ADVANCED DIRECTIVES:   HEALTH MAINTENANCE: History  Substance Use Topics  . Smoking status: Former Smoker -- 0.5 packs/day for 12 years    Quit date: 06/17/1984  . Smokeless tobacco: Never Used  . Alcohol Use: Yes     Comment: weekends, events     Colonoscopy: 2005?  PAP: 2012  Bone density: 04/12/2008/ [facility]l  Lipid panel:  No Known  Allergies  Current Outpatient Prescriptions  Medication Sig Dispense Refill  . emollient (BIAFINE) cream Apply topically as needed.      . nitrofurantoin, macrocrystal-monohydrate, (MACROBID) 100 MG capsule       . non-metallic deodorant (ALRA) MISC Apply 1 application topically daily as needed.      . silver sulfADIAZINE (SILVADENE) 1 % cream Apply topically daily.      . Wound Cleansers (RADIAPLEX EX) Apply topically.        OBJECTIVE: Middle-aged white woman who appears well Filed Vitals:   10/20/12 1553  BP: 120/80  Pulse: 75  Temp: 98.6 F (37 C)  Resp: 20     Body mass index is 30.74 kg/(m^2).    ECOG FS: 0  Sclerae unicteric Oropharynx clear No cervical or supraclavicular adenopathy Lungs no rales or rhonchi Heart regular rate and rhythm Abd benign MSK no focal spinal tenderness, no peripheral edema Neuro: nonfocal Breasts: The right breast is unremarkable. The left breast is status post lumpectomy and radiation. There is minimal hyperpigmentation except in the inferior aspect of the breast, where it is a little bit more marked. There is currently no desquamation. There is no tenderness to call patient and no suspicious skin or nipple finding. The left axilla is benign.  LAB RESULTS: Lab Results  Component Value Date   WBC 4.8 10/20/2012   NEUTROABS 2.6 10/20/2012   HGB 11.8 10/20/2012   HCT 35.9 10/20/2012   MCV 81.1 10/20/2012   PLT 221 10/20/2012      Chemistry      Component Value Date/Time   NA 139 07/08/2012 1052      Component Value Date/Time   CALCIUM 9.5 07/08/2012 1052       No results found for this basename: LABCA2    No components found with this basename: LABCA125    No results found for this basename: INR:1;PROTIME:1 in the last 168 hours  Urinalysis No results found for this basename: colorurine,  appearanceur,  labspec,  phurine,  glucoseu,  hgbur,  bilirubinur,  ketonesur,  proteinur,  urobilinogen,  nitrite,  leukocytesur     STUDIES: Mr Breast Bilateral W Wo Contrast  06/27/2012  *RADIOLOGY REPORT*  Clinical Data:  New diagnosis left sided breast cancer. The patient had a biopsy showing atypical ductal hyperplasia of which, on excision showed invasive ductal carcinoma and DCIS. Family history of breast cancer including mother, cousin and aunt.  BILATERAL BREAST MRI WITH AND WITHOUT CONTRAST  Technique: Multiplanar, multisequence MR images of both breasts were obtained prior to and following the intravenous administration of 19ml of Multihance.  Three dimensional images were evaluated at the independent DynaCad workstation.  Comparison:  10/19/2005.  Findings: Foci of nonspecific enhancement seen bilaterally.  The left breast is smaller than the right with a thin, rim enhancing seroma in the upper-outer quadrant of the left breast posterior and middle third at the lumpectomy site measuring 3.8 x 2.7 x 6.0 cm. No suspicion enhancement seen in the left breast.  No suspicious enhancement or mass is seen  in the right breast.  No axillary or internal mammary adenopathy is seen  IMPRESSION: Postsurgical changes seen in the upper-outer quadrant of the left breast without residual enhancement or mass.  No MRI specific evidence of malignancy, right breast.  RECOMMENDATION: Treatment plan, left breast.  THREE-DIMENSIONAL MR IMAGE RENDERING ON INDEPENDENT WORKSTATION:  Three-dimensional MR images were rendered by post-processing of the original MR data on an independent workstation.  The three- dimensional MR images were interpreted, and findings were reported in the accompanying complete MRI report for this study.  BI-RADS CATEGORY 6:  Known biopsy-proven malignancy - appropriate action should be taken.   Original Report Authenticated By: Hiram Gash, M.D.    Mm Breast Surgical Specimen  06/17/2012  *RADIOLOGY REPORT*  Clinical Data:  Atypical ductal hyperplasia diagnosed following biopsy of left breast calcifications in August  2013.  NEEDLE LOCALIZATION WITH MAMMOGRAPHIC GUIDANCE AND SPECIMEN RADIOGRAPH  Comparison:  Previous exams.  Patient presents for needle localization prior to excisional biopsy.  I met with the patient and we discussed the procedure of needle localization including benefits and alternatives. We discussed the high likelihood of a successful procedure. We discussed the risks of the procedure, including infection, bleeding, tissue injury, and further surgery. Informed, written consent was given.  Using mammographic guidance, sterile technique, 2% lidocaine and a 9 cm modified Kopans needle, eight biopsy clip at the site of prior stereotactic biopsy was localized using a lateral to medial approach.  The films are marked for Dr. [name].  Specimen radiograph was performed at [facility], and confirms the biopsy clip, a few faint calcifications, and an intact wire present in the tissue sample.  The specimen is marked for pathology.  IMPRESSION: Needle localization of the left breast breast.  No apparent complications.   Original Report Authenticated By: Britta Mccreedy, M.D.    Mm Breast Wire Localization Left  06/17/2012  *RADIOLOGY REPORT*  Clinical Data:  Atypical ductal hyperplasia diagnosed following biopsy of left breast calcifications in August 2013.  NEEDLE LOCALIZATION WITH MAMMOGRAPHIC GUIDANCE AND SPECIMEN RADIOGRAPH  Comparison:  Previous exams.  Patient presents for needle localization prior to excisional biopsy.  I met with the patient and we discussed the procedure of needle localization including benefits and alternatives. We discussed the high likelihood of a successful procedure. We discussed the risks of the procedure, including infection, bleeding, tissue injury, and further surgery. Informed, written consent was given.  Using mammographic guidance, sterile technique, 2% lidocaine and a 9 cm modified Kopans needle, eight biopsy clip at the site of prior stereotactic biopsy was localized using a lateral  to medial approach.  The films are marked for Dr. [name].  Specimen radiograph was performed at [facility], and confirms the biopsy clip, a few faint calcifications, and an intact wire present in the tissue sample.  The specimen is marked for pathology.  IMPRESSION: Needle localization of the left breast breast.  No apparent complications.   Original Report Authenticated By: Britta Mccreedy, M.D.     ASSESSMENT: 62 y.o. [place] woman status post left lumpectomy with no axillary lymph node sampling 06/17/2012 for a pT1c cN0, clinical stage IA invasive ductal carcinoma, grade 2, 100% estrogen and 100% progesterone receptor positive, HER-2 negative, with an MIB-1 of 16%.  (1) left sentinel lymph node biopsy 07/25/2012 showed 0 of 3 lymph nodes positive  (2) Oncotype DX shows a recurrence score of 21, predicting a distant recurrence risk within 10 years of surgery a 14% if the patient's only systemic treatment  is tamoxifen for 5 years  (3) adjuvant radiation therapy completed 10/03/2012  (4) tamoxifen started January 2014  PLAN: We spent the better part of her visit today going over the differences between tamoxifen and the aromatase inhibitors and after much discussion she felt that tamoxifen was a better choice for her and I went ahead and wrote her the prescription. We will do this assuming she tolerates it well for at least 2 years at that time she may choose to switch to an aromatase inhibitor or she may want to continue tamoxifen for a total of 10 years. She understands that tamoxifen alone for 5 years is inferior to other options.  She knows to call for any problems that may develop before next visit here, which will be in 3 months.  Kayvion Arneson C    10/20/2012

## 2012-10-21 ENCOUNTER — Telehealth: Payer: Self-pay | Admitting: *Deleted

## 2012-10-21 NOTE — Telephone Encounter (Signed)
see me in 3 months, PM appt no labs

## 2012-11-03 ENCOUNTER — Encounter: Payer: Self-pay | Admitting: Radiation Oncology

## 2012-11-03 ENCOUNTER — Ambulatory Visit
Admission: RE | Admit: 2012-11-03 | Discharge: 2012-11-03 | Disposition: A | Discharge status: Home / Self Care | Payer: BC Managed Care – PPO | Source: Ambulatory Visit | Attending: Radiation Oncology | Admitting: Radiation Oncology

## 2012-11-03 VITALS — BP 113/71 | HR 69 | Temp 98.7°F | Resp 20 | Wt 201.0 lb

## 2012-11-03 DIAGNOSIS — C50419 Malignant neoplasm of upper-outer quadrant of unspecified female breast: Secondary | ICD-10-CM

## 2012-11-03 NOTE — Progress Notes (Signed)
Pt denies pain, fatigue, states her skin is healed in tx area of left breast. On Tamoxifen 20 mg daily. No mammogram scheduled at this time.

## 2012-11-04 NOTE — Progress Notes (Signed)
   Department of Radiation Oncology  Phone:  [phone] Fax:        [phone]   Name: Amber Santos   DOB: 1950/11/17  MRN: [id number]    Date: 11/04/2012  Follow Up Visit Note  Diagnosis: Left T1cN0 Breast cancer  Interval since last radiation: 1 month  Interval History: Amber Santos presents today for routine followup.  She is feeling well and doing well. She is tolerating her tamoxifen well. She has not noticed any increase in her hot flashes. She has no fatigue. Her skin has healed up well and is not givine her any problems.   Allergies: No Known Allergies  Medications:  Current Outpatient Prescriptions  Medication Sig Dispense Refill  . emollient (BIAFINE) cream Apply topically as needed.      . nitrofurantoin, macrocrystal-monohydrate, (MACROBID) 100 MG capsule       . silver sulfADIAZINE (SILVADENE) 1 % cream Apply topically daily.      . tamoxifen (NOLVADEX) 20 MG tablet Take 1 tablet (20 mg total) by mouth daily.  90 tablet  12  . Wound Cleansers (RADIAPLEX EX) Apply topically.        Physical Exam:   weight is 201 lb (91.173 kg). Her oral temperature is 98.7 F (37.1 C). Her blood pressure is 113/71 and her pulse is 69. Her respiration is 20.  Hyper pigmentation in the inframmary fold of the left breast. Otherwise an excellent cosmetic result  IMPRESSION: Amber Santos is a 62 y.o. female with left breast cancer s/p lumpectomy with resolving acute effects of treatment  PLAN:  Doing well. F/u with med onc in April. Annual mammogram to be scheduled by med onc. F/u here prn.     Lurline Hare, MD

## 2012-11-14 ENCOUNTER — Telehealth: Payer: Self-pay | Admitting: Genetic Counselor

## 2012-11-14 NOTE — Telephone Encounter (Signed)
Left message that we have her test results back and to please call me back.

## 2012-11-15 ENCOUNTER — Telehealth: Payer: Self-pay | Admitting: Genetic Counselor

## 2012-11-15 NOTE — Telephone Encounter (Signed)
Revealed BRCA2 mutation found on testing.  Patietn would like a meeting between Drs Magrinat, Michell Heinrich and myself to discuss this result and a few other findings.

## 2012-11-22 ENCOUNTER — Other Ambulatory Visit: Payer: Self-pay | Admitting: Oncology

## 2012-11-25 ENCOUNTER — Telehealth: Payer: Self-pay | Admitting: Oncology

## 2012-11-25 NOTE — Telephone Encounter (Signed)
LMONVM ADVIISNG THE PT OF HER APPT ON 12/02/2012@12 :NOON.

## 2012-11-28 ENCOUNTER — Other Ambulatory Visit: Payer: Self-pay | Admitting: Oncology

## 2012-11-30 ENCOUNTER — Telehealth: Payer: Self-pay | Admitting: Oncology

## 2012-11-30 NOTE — Telephone Encounter (Signed)
S/W THE PT AND SHE IS AWARE OF HER MD APPT TIME CHANGE FROM 12:NOON TO 1:30PM WITH DR Darnelle Catalan ON 12/02/2012

## 2012-12-02 ENCOUNTER — Ambulatory Visit (HOSPITAL_BASED_OUTPATIENT_CLINIC_OR_DEPARTMENT_OTHER): Payer: BC Managed Care – PPO | Admitting: Oncology

## 2012-12-02 VITALS — BP 137/80 | HR 74 | Temp 98.1°F | Resp 20 | Ht 68.0 in | Wt 203.8 lb

## 2012-12-02 DIAGNOSIS — C50412 Malignant neoplasm of upper-outer quadrant of left female breast: Secondary | ICD-10-CM

## 2012-12-02 DIAGNOSIS — Z170 Estrogen receptor positive status [ER+]: Secondary | ICD-10-CM

## 2012-12-02 DIAGNOSIS — C50419 Malignant neoplasm of upper-outer quadrant of unspecified female breast: Secondary | ICD-10-CM

## 2012-12-04 NOTE — Progress Notes (Signed)
ID: [name]   DOB: 04-30-1951  MR#: [id number]  CSN#:[id number]  PCP: Annamaria Boots, MD GYN:  SU: Claud Kelp MD OTHER MD: Hal Neer   HISTORY OF PRESENT ILLNESS: Amber Santos had routine screening mammography at [facility] 05/23/2022 showing new calcifications in the left breast. Diagnostic left mammography 05/27/2012 confirmed a cluster of faint calcifications in the upper outer quadrant. Biopsy was performed 06/01/2012, and showed atypical ductal hyperplasia ([id number]). Accordingly the patient underwent left lumpectomy 06/17/2012. This showed ([id number]) invasive ductal carcinoma, grade 2, measuring 1.4 cm, with associated high-grade ductal carcinoma in situ. There were 2 additional areas of ductal carcinoma in situ, but margins were clear. The invasive tumor was 100% estrogen receptor and 100% progesterone receptor positive, with an MIB-1 of 20%. It was HER-2 negative.   On 06/26/2012 the patient underwent bilateral breast MRI. This found a seroma in the upper outer quadrant of the left breast measuring 6 cm. There was no other area of suspicious enhancement in the left breast, no findings of concern in the right breast, and no axillary or internal mammary adenopathy. The patient's subsequent history is as detailed below  INTERVAL HISTORY: Amber Santos returns today with her husband Amber Santos for followup of her breast cancer. Since her last visit here she has been found to be BRCA2 positive. She is here today to discuss the implications of that finding. A lawyer requested that the meeting included Dr. Michell Heinrich, who was present today as well.  REVIEW OF SYSTEMS: She had "too bad days" during radiation, with a significant amount of wet desquamation. She found the Biafine and radio plexus creams not Berry helpful. Silvadene was better. That'll problem has largely resolved. She did have a little bit of fatigue but continued to work full-time right through the radiation treatments.  She describes her energy now is normal. Also the pain she was having in the breast is gone a detailed review of systems is otherwise noncontributory.Marland Kitchen  PAST MEDICAL HISTORY: Past Medical History  Diagnosis Date  . Anemia   . Breast cancer     high grade ductal ca left breast    PAST SURGICAL HISTORY: Past Surgical History  Procedure Laterality Date  . Cesarean section  1986, 1987  . Anterior cervical decomp/discectomy fusion  01/10/2009    C4-7  . Breast lumpectomy  06/17/2012    left partial mastectomy  . Axillary lymph node biopsy  07/25/12    left    FAMILY HISTORY Family History  Problem Relation Age of Onset  . Breast cancer Mother 39  . Cancer Maternal Aunt     breast, pancreatic  . Colon cancer Maternal Uncle   . Breast cancer Maternal Aunt 28  . Colon cancer Maternal Grandmother   . Kidney cancer Maternal Uncle   . Breast cancer Cousin     2 maternal cousins dx in 79s   the patient's father died at the age of 43 from an accident. The patient's mother is living, currently 74 years old. She has a history of breast cancer, postmenopausal. The patient has one brother, no sisters. In addition the patient has a maternal aunt who was diagnosed with breast cancer at age 28 and a cousin who was diagnosed with breast cancer at age 55. She'll says 2 maternal uncles with a history of colon cancer.  GYNECOLOGIC HISTORY: Menarche age 35, first live birth age 31, which of course increases the risk of breast cancer. She is GX P2. She stopped having periods  SOCIAL HISTORY: Amber Santos works as a Runner, broadcasting/film/video. Her husband Amber Santos is V.P. at [employer]. Son Amber Santos lives in [place] and son Amber Santos lives in [place]. Both work for [employer]   ADVANCED DIRECTIVES:   HEALTH MAINTENANCE: History  Substance Use Topics  . Smoking status: Former Smoker -- 0.50 packs/day for 12 years    Quit date: 06/17/1984  . Smokeless tobacco: Never Used  . Alcohol Use: Yes     Comment:  weekends, events     Colonoscopy: 2005?  PAP: 2012  Bone density: 04/12/2008/ [facility] nl  Lipid panel:  No Known Allergies  Current Outpatient Prescriptions  Medication Sig Dispense Refill  . emollient (BIAFINE) cream Apply topically as needed.      . nitrofurantoin, macrocrystal-monohydrate, (MACROBID) 100 MG capsule       . silver sulfADIAZINE (SILVADENE) 1 % cream Apply topically daily.      . tamoxifen (NOLVADEX) 20 MG tablet Take 1 tablet (20 mg total) by mouth daily.  90 tablet  12  . Wound Cleansers (RADIAPLEX EX) Apply topically.       No current facility-administered medications for this visit.    OBJECTIVE: Middle-aged white woman who appears well Filed Vitals:   12/02/12 1318  BP: 137/80  Pulse: 74  Temp: 98.1 F (36.7 C)  Resp: 20     Body mass index is 30.99 kg/(m^2).    ECOG FS: 0  No physical exam was performed today  LAB RESULTS: Lab Results  Component Value Date   WBC 4.8 10/20/2012   NEUTROABS 2.6 10/20/2012   HGB 11.8 10/20/2012   HCT 35.9 10/20/2012   MCV 81.1 10/20/2012   PLT 221 10/20/2012      Chemistry      Component Value Date/Time   NA 139 07/08/2012 1052      Component Value Date/Time   CALCIUM 9.5 07/08/2012 1052       No results found for this basename: LABCA2    No components found with this basename: LABCA125    No results found for this basename: INR,  in the last 168 hours  Urinalysis No results found for this basename: colorurine,  appearanceur,  labspec,  phurine,  glucoseu,  hgbur,  bilirubinur,  ketonesur,  proteinur,  urobilinogen,  nitrite,  leukocytesur    STUDIES: No results found.    ASSESSMENT: 62 y.o. BRCA-2 positive [place] woman status post left lumpectomy with no axillary lymph node sampling 06/17/2012 for a pT1c cN0, clinical stage IA invasive ductal carcinoma, grade 2, 100% estrogen and 100% progesterone receptor positive, HER-2 negative, with an MIB-1 of 16%.  (1) left sentinel lymph node  biopsy 07/25/2012 showed 0 of 3 lymph nodes positive  (2) Oncotype DX shows a recurrence score of 21, predicting a distant recurrence risk within 10 years of surgery a 14% if the patient's only systemic treatment is tamoxifen for 5 years  (3) adjuvant radiation therapy completed 10/03/2012  (4) tamoxifen started January 2014  PLAN: We spent the better part of her hour-long visit today discussing the implications of her BRCA2 positivity. As far as her breast cancer is concerned, the first thing to notice is that BRCA positive tumors respond to treatment just as well as tumors that do not carry the mutation. Accordingly we will been no change in her prognosis or plan as far as her earlier breast cancer is concerned.  The mutation does predict for an increased risk of a new breast cancer developing in you breast. Many  patients choose to have both breasts removed as a result of that risk. However we are able to screen for breast cancer, and BRCA positive patients in addition to yearly mammography have yearly MRI of the breasts. Accordingly it Luvia chooses to keep her breasts there will be no disadvantage from a survival point of view. At this point she is considering keeping her breasts, but proceeding to bilateral mastectomies if and when a new breast cancer were to develop.  The situation with the ovaries is different. The risk of ovarian cancer for BRCA 2 positive patients is in the 20% range. We do not have screening documented to be effective. Accordingly the general consensus is for bilateral salpingo-oophorectomy. Because a very small portion of the fallopian tubes is actually inside the wall of the uterus, and potentially cancer could arise from those stumps, some authorities recommend hysterectomy as well as bilateral salpingo-oophorectomy. Dr. Michell Heinrich and I are comfortable with both options, namely bilateral salpingo-oophorectomy alone, or total abdominal hysterectomy with bilateral  salpingo-oophorectomy. [place] will be discussing that with her gynecologist in light of the possible benefits as well as the positive intraoperative or postoperative complications.  Finally we recommended that Rosia let all the relatives know that she does carry this mutation so they may make her own decisions regarding testing. She understands that her testing will be considerably cheaper since a specific mutation has been identified.  We are setting going up for repeat MRI at the time of her mammography in August of this year. She is going to return to see me mid April just to make sure she continues to tolerate the tamoxifen well and in case she has other questions that may develop before that. She knows to call regarding any other issues.  MAGRINAT,GUSTAV C    12/04/2012

## 2012-12-07 ENCOUNTER — Telehealth: Payer: Self-pay | Admitting: Oncology

## 2012-12-07 NOTE — Telephone Encounter (Signed)
S/W KATHY MCCONNELL FROM THE BREAST CENTER AND SHE WILL CONTACT THE PT WITH THE BREAST MRI APPT CLOSER TO AUG.

## 2012-12-19 ENCOUNTER — Other Ambulatory Visit: Payer: Self-pay | Admitting: Obstetrics & Gynecology

## 2012-12-19 DIAGNOSIS — Z1501 Genetic susceptibility to malignant neoplasm of breast: Secondary | ICD-10-CM

## 2012-12-26 ENCOUNTER — Encounter: Payer: Self-pay | Admitting: Obstetrics & Gynecology

## 2012-12-26 ENCOUNTER — Ambulatory Visit (INDEPENDENT_AMBULATORY_CARE_PROVIDER_SITE_OTHER): Payer: BC Managed Care – PPO

## 2012-12-26 ENCOUNTER — Ambulatory Visit (INDEPENDENT_AMBULATORY_CARE_PROVIDER_SITE_OTHER): Payer: BC Managed Care – PPO | Admitting: Obstetrics & Gynecology

## 2012-12-26 DIAGNOSIS — Z8742 Personal history of other diseases of the female genital tract: Secondary | ICD-10-CM

## 2012-12-26 DIAGNOSIS — R9389 Abnormal findings on diagnostic imaging of other specified body structures: Secondary | ICD-10-CM

## 2012-12-26 DIAGNOSIS — N800 Endometriosis of uterus: Secondary | ICD-10-CM

## 2012-12-26 DIAGNOSIS — Z1501 Genetic susceptibility to malignant neoplasm of breast: Secondary | ICD-10-CM

## 2012-12-26 DIAGNOSIS — Z1502 Genetic susceptibility to malignant neoplasm of ovary: Secondary | ICD-10-CM | POA: Insufficient documentation

## 2012-12-26 DIAGNOSIS — N83339 Acquired atrophy of ovary and fallopian tube, unspecified side: Secondary | ICD-10-CM

## 2012-12-26 DIAGNOSIS — N8300 Follicular cyst of ovary, unspecified side: Secondary | ICD-10-CM

## 2012-12-26 DIAGNOSIS — Z1509 Genetic susceptibility to other malignant neoplasm: Secondary | ICD-10-CM

## 2012-12-26 NOTE — Patient Instructions (Signed)
We will call to schedule your pre-op visit and ultrasound.  This will done within 30 days of your surgery.

## 2012-12-26 NOTE — Progress Notes (Signed)
62 y.o. G2P2 Married Caucasian female for discussion of pelvic ultrasound done for genetic succeptibility to ovarian cancer.  Her BRCA 2 testing after breast cancer diagnosis was positive.  She has decided to proceed with removal of ovaries and uterus but wants to wait until the summer.  She is here for an ultrasound to ensure we are not missing anything and therefore waiting is appropriate.  She is on Tamoxifen now and has no vaginal bleeding. Patient with history of endometriosis.  No LMP recorded. Patient is postmenopausal.          Sexually active: yes , post menopausal status.     Transvaginal ultrasound results: Uterus: present,was seen. Measured 9.1 x 4.8 x 4.1cm anteverted, smooth  contour Endometrium: was seen 8.41mm containing cystic spaces. Adnexa: Left:  Ovary present measuring 2.3 x 1.4 x 0.8cm and containing an avascular 1.48mm collapsing cyst.  Findings were avascular.  Right:  Ovary present measuring 1.7  1.6 x 0.8cm.  Atrophic appearing. Cul de sac:  No free fluid   Images reviewed with patient.  Results discussed  Assessment:  BRCA 2 + status, desirous of hysterectomy and BSO--planned in June.  History of endometriosis.  Cystic spaces on ultrasound consistent with adenomyosis. Plan: Robotic assisted TLH with BSO scheduled. Patient aware to call with any vaginal bleeding. Repeat U/S will be done prior to surgery.  15 minutes spent with patient.  >50% of time spent in face to face counseling.

## 2013-01-06 ENCOUNTER — Encounter: Payer: Self-pay | Admitting: Genetic Counselor

## 2013-01-19 ENCOUNTER — Ambulatory Visit: Payer: BC Managed Care – PPO | Admitting: Oncology

## 2013-01-31 ENCOUNTER — Telehealth: Payer: Self-pay | Admitting: *Deleted

## 2013-01-31 NOTE — Telephone Encounter (Signed)
Received call from pt requesting to speak w/Dr Michell Heinrich. Informed pt Dr Michell Heinrich is seeing pt's today, unavailable at this time. Inquired to pt's reason for request; she would not share. Informed pt will route her request to Dr Michell Heinrich. Pt left call back # [phone]. Notified Dr Michell Heinrich verbally and through in basket.

## 2013-03-15 ENCOUNTER — Telehealth: Payer: Self-pay | Admitting: Obstetrics & Gynecology

## 2013-03-16 NOTE — Telephone Encounter (Signed)
Patient letting me know that she has decided to proceed with surgery as planned on 05-01-13.  Surgery instructions reviewed and pre/post op appointments scheduled. PUS scheduled for 03-24-13 prior to Preop on 04-10-13.

## 2013-03-20 ENCOUNTER — Other Ambulatory Visit: Payer: Self-pay | Admitting: Obstetrics & Gynecology

## 2013-03-20 DIAGNOSIS — R9389 Abnormal findings on diagnostic imaging of other specified body structures: Secondary | ICD-10-CM

## 2013-03-23 ENCOUNTER — Telehealth: Payer: Self-pay | Admitting: Obstetrics & Gynecology

## 2013-03-23 NOTE — Telephone Encounter (Signed)
LMTCB, patient called back within a minute and agreed to $30 copay.

## 2013-03-24 ENCOUNTER — Ambulatory Visit (INDEPENDENT_AMBULATORY_CARE_PROVIDER_SITE_OTHER): Payer: BC Managed Care – PPO

## 2013-03-24 ENCOUNTER — Ambulatory Visit (INDEPENDENT_AMBULATORY_CARE_PROVIDER_SITE_OTHER): Payer: BC Managed Care – PPO | Admitting: Obstetrics & Gynecology

## 2013-03-24 ENCOUNTER — Other Ambulatory Visit: Payer: Self-pay | Admitting: Obstetrics & Gynecology

## 2013-03-24 DIAGNOSIS — N840 Polyp of corpus uteri: Secondary | ICD-10-CM

## 2013-03-24 DIAGNOSIS — N83339 Acquired atrophy of ovary and fallopian tube, unspecified side: Secondary | ICD-10-CM

## 2013-03-24 DIAGNOSIS — R9389 Abnormal findings on diagnostic imaging of other specified body structures: Secondary | ICD-10-CM

## 2013-03-24 DIAGNOSIS — Z79810 Long term (current) use of selective estrogen receptor modulators (SERMs): Secondary | ICD-10-CM

## 2013-03-24 DIAGNOSIS — Z8742 Personal history of other diseases of the female genital tract: Secondary | ICD-10-CM

## 2013-03-24 DIAGNOSIS — Z1501 Genetic susceptibility to malignant neoplasm of breast: Secondary | ICD-10-CM

## 2013-03-24 MED ORDER — IBUPROFEN 800 MG PO TABS: 800.0000 mg | ORAL_TABLET | Freq: Three times a day (TID) | ORAL | Status: DC | PRN

## 2013-03-24 MED ORDER — OXYCODONE-ACETAMINOPHEN 5-325 MG PO TABS: 2.0000 | ORAL_TABLET | ORAL | Status: DC | PRN

## 2013-03-24 NOTE — Progress Notes (Signed)
62 y.o. Amber Santos here for a pelvic ultrasound.  She is planning hysterectomy due to BRCA II abnormality.  Surgery is scheduled.  Husband recently diagnosed with prostate cancer.  Surgery is next week with Dr. Crecencio Mc.   Patient has more anxiety about spouse than herself.  Pre-procedure ultrasound recommended just to assess ovaries and make sure simple hysterectomy is correct procedure.  No LMP recorded. Patient is postmenopausal.  Sexually active:  yes  Contraception: post-menopausal  Technique:  Both transabdominal and transvaginal ultrasound examinations of the pelvis were performed. Transabdominal technique was performed for global imaging of the pelvis including uterus, ovaries, adnexal regions, and pelvic cul-de-sac. It was necessary to proceed with endovaginal exam following the abdominal ultrasound transabdominal exam to visualize the endometrium and adnexa.  Color and duplex Doppler ultrasound was utilized to evaluate blood  flow to the ovaries.   FINDINGS: UTERUS: 8.6 x 5.3 x 3.9cm EMS:  8.71mm with positive vascular flow ADNEXA:   Left ovary 1.8 x 1.5 x 1.2cm, atrophic   Right ovary 1.9 x 1.8 x 1.2cm, atrophic CUL DE SAC: neg  SHSG:  After obtaining appropriate verbal consent from patient, the cervix was visualized using a speculum, and prepped with betadine.  A tenaculum was applied to the cervix.  Dilation of the cervix was not necessary.  The catheter was passed into the uterus an sterile saline introduced, with the following findings:   Left posterior wall defect 19 x 16 x 9mm, consistent with polyp  Endometrial biopsy recommended.  Discussed with patient.  Verbal and written consent obtained.   Procedure:  Speculum placed.  Cervix visualized and cleansed with betadine prep.  A single toothed tenaculum was applied to the anterior lip of the cervix.  Endometrial pipelle was advanced through the cervix into the endometrial cavity without difficulty.  Pipelle passed to  7.5cm.  Suction applied and pipelle removed with good tissue sample obtained.  Tenculum removed.  No bleeding noted.  Patient tolerated procedure well.   Past Medical History  Diagnosis Date  . Anemia   . Breast cancer     high grade ductal ca left breast  Also, DVT due to crush injury  Past Surgical History  Procedure Laterality Date  . Cesarean section  1986, 1987  . Anterior cervical decomp/discectomy fusion  01/10/2009    C4-7  . Breast lumpectomy  06/17/2012    left partial mastectomy  . Axillary lymph node biopsy  07/25/12    left   General appearance: alert, cooperative and appears stated age Head: Normocephalic, without obvious abnormality, atraumatic Neck: no adenopathy, supple, symmetrical, trachea midline and thyroid normal to inspection and palpation Lungs: clear to auscultation bilaterally Heart: regular rate and rhythm Abdomen: soft, non-tender; bowel sounds normal; no masses,  no organomegaly Extremities: extremities normal, atraumatic, no cyanosis or edema Skin: Skin color, texture, turgor normal. No rashes or lesions Lymph nodes: Cervical, supraclavicular, and axillary nodes normal. No abnormal inguinal nodes palpated Neurologic: Grossly normal  Pelvic: External genitalia:  no lesions              Urethra:  normal appearing urethra with no masses, tenderness or lesions              Bartholins and Skenes: normal                 Vagina: normal appearing vagina with normal color and discharge, no lesions              Cervix: no  lesions              Pap taken: no Bimanual Exam:  Uterus:  normal size, contour, position, consistency, mobility, non-tender              Adnexa: normal adnexa and no mass, fullness, tenderness               Rectovaginal: Confirms               Anus:  normal sphincter tone, no lesions  Procedure discussed with patient.  Hospital stay, recovery and pain management all discussed.  Risks discussed including but not limited to bleeding, 1%  risk of receiving a  transfusion, infection, 3-4% risk of bowel/bladder/ureteral/vascular injury discussed as well as possible need for additional surgery if injury does occur discussed.  DVT/PE and rare risk of death discussed.  My actual complications with prior surgeries discussed.  Vaginal cuff dehiscence discussed.  Hernia formation discussed.  Positioning and incision locations discussed.  Patient aware if pathology abnormal she may need additional treatment.  All questions answered.    Assessment:  BRCA II positive patient, H/O breast cancer, H/O endometriosis, Endometrial polyp Plan: Proceeding with robotic assisted TLH/BSO.  Plan washings at same time.    ~30 minutes spent with patient >50% of time was in face to face discussion of above.

## 2013-03-24 NOTE — Patient Instructions (Addendum)
Please call if anything changes or you need anything before surgery

## 2013-03-26 ENCOUNTER — Encounter: Payer: Self-pay | Admitting: Obstetrics & Gynecology

## 2013-03-26 DIAGNOSIS — N840 Polyp of corpus uteri: Secondary | ICD-10-CM | POA: Insufficient documentation

## 2013-03-26 DIAGNOSIS — Z8742 Personal history of other diseases of the female genital tract: Secondary | ICD-10-CM | POA: Insufficient documentation

## 2013-03-28 NOTE — Telephone Encounter (Signed)
Returning Dr. Rondel Baton call. Tresa Endo took the call.

## 2013-04-10 ENCOUNTER — Ambulatory Visit: Payer: Self-pay | Admitting: Obstetrics & Gynecology

## 2013-04-10 NOTE — Telephone Encounter (Signed)
Dr Hyacinth Meeker in surgery all day, unsure how patient did not get rescheduled but call to patient and left message that all is still on schedule for surgery, call back and we will resched preop.

## 2013-04-10 NOTE — Telephone Encounter (Signed)
Patient returned call, explained that Dr Hyacinth Meeker did her preop discussions at PUS appointment and that is why it was canceled.  Patient asking about preop at hospital, notified that they frequently do not call until closer to date but patient will be traveling so she needs to get this scheduled.  Given the phone number to preop dept at hospital to schedule.

## 2013-04-10 NOTE — Telephone Encounter (Signed)
pt wants to know when her pre op appointment is schedule for the hosiptal. Pt is confused about surgery consult appointment that was canceled for today with Dr. Hyacinth Meeker

## 2013-04-13 ENCOUNTER — Telehealth: Payer: Self-pay | Admitting: Obstetrics & Gynecology

## 2013-04-13 NOTE — Telephone Encounter (Signed)
Call to patient to arrange specific date that patient will pay for her surgery. The patient answered the phone and said "don't call me anymore" and hung up. Routing to [place].

## 2013-04-19 NOTE — Telephone Encounter (Signed)
Per administration, ok to wait on payment and do not need to contact patient again prior to surgery.

## 2013-04-20 ENCOUNTER — Encounter (HOSPITAL_COMMUNITY): Payer: Self-pay | Admitting: Pharmacy Technician

## 2013-04-26 ENCOUNTER — Other Ambulatory Visit (HOSPITAL_COMMUNITY): Payer: BC Managed Care – PPO

## 2013-04-28 ENCOUNTER — Telehealth: Payer: Self-pay | Admitting: *Deleted

## 2013-04-28 ENCOUNTER — Encounter (HOSPITAL_COMMUNITY): Payer: Self-pay

## 2013-04-28 ENCOUNTER — Encounter (HOSPITAL_COMMUNITY)
Admission: RE | Admit: 2013-04-28 | Discharge: 2013-04-28 | Disposition: A | Discharge status: Home / Self Care | Payer: BC Managed Care – PPO | Source: Ambulatory Visit | Attending: Obstetrics & Gynecology | Admitting: Obstetrics & Gynecology

## 2013-04-28 LAB — CBC
HCT: 37 % (ref 36.0–46.0)
Hemoglobin: 12.1 g/dL (ref 12.0–15.0)
MCV: 82.6 fL (ref 78.0–100.0)
WBC: 3.1 10*3/uL — ABNORMAL LOW (ref 4.0–10.5)

## 2013-04-28 MED ORDER — LACTATED RINGERS IV SOLN: INTRAVENOUS | Status: DC

## 2013-04-28 NOTE — Telephone Encounter (Signed)
Patient walked in to office to ask for assistance with supplemental insurance policy.  She states she has a "cancer policy" that she files separately from her medical plan.  She has contacted [employer] and they siad they would not cover it unless she actually had cancer.  Will also have to submit opeartive report and pathology report. Confirmed at hospital that case is posted with diagnosis 1) history of breast cancer, 2) BRCA II positive and 3) endometriosis.  Will let Dr Hyacinth Meeker know she is trying to file a claim with supplemental cancer policy.  Had patient sign release records so we can mail copy of these reports to her.  This is a supplemental policy and not related to medical coverage.

## 2013-04-28 NOTE — Patient Instructions (Addendum)
22 Amber Santos  04/28/2013   Your procedure is scheduled on:  05/01/13  Enter through the Main Entrance of [facility] at 6 AM.  Pick up the phone at the desk and dial [phone].   Call this number if you have problems the morning of surgery: [phone]   Remember:   Do not eat food:After Midnight.  Do not drink clear liquids: After Midnight.  Take these medicines the morning of surgery with A SIP OF WATER: Tamoxifen   Do not wear jewelry, make-up or nail polish.  Do not wear lotions, powders, or perfumes. You may wear deodorant.  Do not shave 48 hours prior to surgery.  Do not bring valuables to the hospital.  [facility] is not responsible                  for any belongings or valuables brought to the hospital.  Contacts, dentures or bridgework may not be worn into surgery.  Leave suitcase in the car. After surgery it may be brought to your room.  For patients admitted to the hospital, checkout time is 11:00 AM the day of                discharge.   Patients discharged the day of surgery will not be allowed to drive                   home.  Name and phone number of your driver: NA  Special Instructions: Shower using CHG 2 nights before surgery and the night before surgery.  If you shower the day of surgery use CHG.  Use special wash - you have one bottle of CHG for all showers.  You should use approximately 1/3 of the bottle for each shower.   Please read over the following fact sheets that you were given: Surgical Site Infection Prevention

## 2013-05-01 ENCOUNTER — Ambulatory Visit (HOSPITAL_COMMUNITY): Payer: BC Managed Care – PPO | Admitting: Anesthesiology

## 2013-05-01 ENCOUNTER — Encounter (HOSPITAL_COMMUNITY): Payer: Self-pay | Admitting: Anesthesiology

## 2013-05-01 ENCOUNTER — Encounter (HOSPITAL_COMMUNITY)
Admission: RE | Disposition: A | Discharge status: Home / Self Care | Payer: Self-pay | Source: Ambulatory Visit | Attending: Obstetrics & Gynecology

## 2013-05-01 ENCOUNTER — Ambulatory Visit (HOSPITAL_COMMUNITY)
Admission: RE | Admit: 2013-05-01 | Discharge: 2013-05-02 | Disposition: A | Discharge status: Home / Self Care | Payer: BC Managed Care – PPO | Source: Ambulatory Visit | Attending: Obstetrics & Gynecology | Admitting: Obstetrics & Gynecology

## 2013-05-01 ENCOUNTER — Encounter (HOSPITAL_COMMUNITY): Payer: Self-pay | Admitting: *Deleted

## 2013-05-01 DIAGNOSIS — Z170 Estrogen receptor positive status [ER+]: Secondary | ICD-10-CM | POA: Insufficient documentation

## 2013-05-01 DIAGNOSIS — Z1502 Genetic susceptibility to malignant neoplasm of ovary: Secondary | ICD-10-CM

## 2013-05-01 DIAGNOSIS — D251 Intramural leiomyoma of uterus: Secondary | ICD-10-CM

## 2013-05-01 DIAGNOSIS — N840 Polyp of corpus uteri: Secondary | ICD-10-CM

## 2013-05-01 DIAGNOSIS — N801 Endometriosis of ovary: Secondary | ICD-10-CM

## 2013-05-01 DIAGNOSIS — C50919 Malignant neoplasm of unspecified site of unspecified female breast: Secondary | ICD-10-CM | POA: Insufficient documentation

## 2013-05-01 DIAGNOSIS — N8000 Endometriosis of the uterus, unspecified: Secondary | ICD-10-CM | POA: Insufficient documentation

## 2013-05-01 DIAGNOSIS — Z86718 Personal history of other venous thrombosis and embolism: Secondary | ICD-10-CM | POA: Insufficient documentation

## 2013-05-01 HISTORY — PX: CYSTOSCOPY: SHX5120

## 2013-05-01 HISTORY — PX: ROBOTIC ASSISTED TOTAL HYSTERECTOMY WITH BILATERAL SALPINGO OOPHERECTOMY: SHX6086

## 2013-05-01 SURGERY — ROBOTIC ASSISTED TOTAL HYSTERECTOMY WITH BILATERAL SALPINGO OOPHORECTOMY
Anesthesia: General | Site: Urethra | Wound class: Clean Contaminated

## 2013-05-01 MED ORDER — MORPHINE SULFATE 4 MG/ML IJ SOLN
1.0000 mg | INTRAMUSCULAR | Status: DC | PRN
  Administered 2013-05-01: 2 mg via INTRAVENOUS
  Filled 2013-05-01: qty 1

## 2013-05-01 MED ORDER — EPHEDRINE SULFATE 50 MG/ML IJ SOLN: INTRAMUSCULAR | Status: DC | PRN

## 2013-05-01 MED ORDER — FENTANYL CITRATE 0.05 MG/ML IJ SOLN
INTRAMUSCULAR | Status: AC
  Filled 2013-05-01: qty 5

## 2013-05-01 MED ORDER — ROCURONIUM BROMIDE 100 MG/10ML IV SOLN
INTRAVENOUS | Status: DC | PRN
  Administered 2013-05-01: 20 mg via INTRAVENOUS
  Administered 2013-05-01 (×2): 10 mg via INTRAVENOUS
  Administered 2013-05-01: 50 mg via INTRAVENOUS

## 2013-05-01 MED ORDER — EPHEDRINE SULFATE 50 MG/ML IJ SOLN
INTRAMUSCULAR | Status: DC | PRN
  Administered 2013-05-01: 10 mg via INTRAVENOUS

## 2013-05-01 MED ORDER — INDIGOTINDISULFONATE SODIUM 8 MG/ML IJ SOLN
INTRAMUSCULAR | Status: DC | PRN
  Administered 2013-05-01: 5 mL via INTRAVENOUS

## 2013-05-01 MED ORDER — KETOROLAC TROMETHAMINE 30 MG/ML IJ SOLN: 15.0000 mg | Freq: Once | INTRAMUSCULAR | Status: DC | PRN

## 2013-05-01 MED ORDER — FENTANYL CITRATE 0.05 MG/ML IJ SOLN
25.0000 ug | INTRAMUSCULAR | Status: DC | PRN
  Administered 2013-05-01: 50 ug via INTRAVENOUS

## 2013-05-01 MED ORDER — NEOSTIGMINE METHYLSULFATE 1 MG/ML IJ SOLN
INTRAMUSCULAR | Status: DC | PRN
  Administered 2013-05-01: 4 mg via INTRAVENOUS

## 2013-05-01 MED ORDER — OXYCODONE-ACETAMINOPHEN 5-325 MG PO TABS
1.0000 | ORAL_TABLET | ORAL | Status: DC | PRN
  Administered 2013-05-01 – 2013-05-02 (×2): 2 via ORAL
  Filled 2013-05-01 (×2): qty 2

## 2013-05-01 MED ORDER — PROPOFOL 10 MG/ML IV EMUL
INTRAVENOUS | Status: AC
  Filled 2013-05-01: qty 20

## 2013-05-01 MED ORDER — ONDANSETRON HCL 4 MG/2ML IJ SOLN
INTRAMUSCULAR | Status: DC | PRN
  Administered 2013-05-01: 4 mg via INTRAVENOUS

## 2013-05-01 MED ORDER — GLYCOPYRROLATE 0.2 MG/ML IJ SOLN
INTRAMUSCULAR | Status: DC | PRN
  Administered 2013-05-01: 0.2 mg via INTRAVENOUS
  Administered 2013-05-01: .8 mg via INTRAVENOUS

## 2013-05-01 MED ORDER — ONDANSETRON HCL 4 MG/2ML IJ SOLN
INTRAMUSCULAR | Status: AC
  Filled 2013-05-01: qty 2

## 2013-05-01 MED ORDER — HYDROMORPHONE HCL PF 1 MG/ML IJ SOLN
INTRAMUSCULAR | Status: AC
  Filled 2013-05-01: qty 1

## 2013-05-01 MED ORDER — NEOSTIGMINE METHYLSULFATE 1 MG/ML IJ SOLN
INTRAMUSCULAR | Status: AC
  Filled 2013-05-01: qty 1

## 2013-05-01 MED ORDER — DEXTROSE 5 % IV SOLN: 3.0000 g | INTRAVENOUS | Status: DC

## 2013-05-01 MED ORDER — PANTOPRAZOLE SODIUM 40 MG IV SOLR
40.0000 mg | Freq: Every day | INTRAVENOUS | Status: DC
  Administered 2013-05-01: 40 mg via INTRAVENOUS
  Filled 2013-05-01 (×2): qty 40

## 2013-05-01 MED ORDER — GLYCOPYRROLATE 0.2 MG/ML IJ SOLN
INTRAMUSCULAR | Status: AC
  Filled 2013-05-01: qty 1

## 2013-05-01 MED ORDER — FENTANYL CITRATE 0.05 MG/ML IJ SOLN
INTRAMUSCULAR | Status: AC
  Filled 2013-05-01: qty 2

## 2013-05-01 MED ORDER — DEXAMETHASONE SODIUM PHOSPHATE 10 MG/ML IJ SOLN
INTRAMUSCULAR | Status: AC
  Filled 2013-05-01: qty 1

## 2013-05-01 MED ORDER — LIDOCAINE HCL (CARDIAC) 20 MG/ML IV SOLN
INTRAVENOUS | Status: AC
  Filled 2013-05-01: qty 5

## 2013-05-01 MED ORDER — FENTANYL CITRATE 0.05 MG/ML IJ SOLN
INTRAMUSCULAR | Status: DC | PRN
  Administered 2013-05-01: 50 ug via INTRAVENOUS
  Administered 2013-05-01 (×2): 100 ug via INTRAVENOUS

## 2013-05-01 MED ORDER — MIDAZOLAM HCL 5 MG/5ML IJ SOLN
INTRAMUSCULAR | Status: DC | PRN
  Administered 2013-05-01: 2 mg via INTRAVENOUS

## 2013-05-01 MED ORDER — MIDAZOLAM HCL 2 MG/2ML IJ SOLN
INTRAMUSCULAR | Status: AC
  Filled 2013-05-01: qty 2

## 2013-05-01 MED ORDER — KETOROLAC TROMETHAMINE 30 MG/ML IJ SOLN: 30.0000 mg | Freq: Four times a day (QID) | INTRAMUSCULAR | Status: DC

## 2013-05-01 MED ORDER — MEPERIDINE HCL 25 MG/ML IJ SOLN: 6.2500 mg | INTRAMUSCULAR | Status: DC | PRN

## 2013-05-01 MED ORDER — SCOPOLAMINE 1 MG/3DAYS TD PT72
1.0000 | MEDICATED_PATCH | TRANSDERMAL | Status: DC
  Administered 2013-05-01: 1.5 mg via TRANSDERMAL

## 2013-05-01 MED ORDER — INDIGOTINDISULFONATE SODIUM 8 MG/ML IJ SOLN
INTRAMUSCULAR | Status: AC
  Filled 2013-05-01: qty 5

## 2013-05-01 MED ORDER — SCOPOLAMINE 1 MG/3DAYS TD PT72
MEDICATED_PATCH | TRANSDERMAL | Status: AC
  Filled 2013-05-01: qty 1

## 2013-05-01 MED ORDER — ENOXAPARIN SODIUM 40 MG/0.4ML ~~LOC~~ SOLN
40.0000 mg | SUBCUTANEOUS | Status: DC
  Administered 2013-05-01: 40 mg via SUBCUTANEOUS
  Filled 2013-05-01 (×2): qty 0.4

## 2013-05-01 MED ORDER — ROPIVACAINE HCL 5 MG/ML IJ SOLN
INTRAMUSCULAR | Status: DC | PRN
  Administered 2013-05-01: 70 mL

## 2013-05-01 MED ORDER — ARTIFICIAL TEARS OP OINT
TOPICAL_OINTMENT | OPHTHALMIC | Status: AC
  Filled 2013-05-01: qty 3.5

## 2013-05-01 MED ORDER — CEFAZOLIN SODIUM-DEXTROSE 2-3 GM-% IV SOLR
2.0000 g | INTRAVENOUS | Status: AC
  Administered 2013-05-01: 2 g via INTRAVENOUS

## 2013-05-01 MED ORDER — PROPOFOL 10 MG/ML IV BOLUS
INTRAVENOUS | Status: DC | PRN
  Administered 2013-05-01: 170 mg via INTRAVENOUS

## 2013-05-01 MED ORDER — LACTATED RINGERS IV SOLN
INTRAVENOUS | Status: DC
  Administered 2013-05-01 (×2): via INTRAVENOUS

## 2013-05-01 MED ORDER — SIMETHICONE 80 MG PO CHEW
80.0000 mg | CHEWABLE_TABLET | Freq: Four times a day (QID) | ORAL | Status: DC | PRN
  Administered 2013-05-02: 80 mg via ORAL

## 2013-05-01 MED ORDER — PROMETHAZINE HCL 25 MG/ML IJ SOLN: 6.2500 mg | INTRAMUSCULAR | Status: DC | PRN

## 2013-05-01 MED ORDER — ROPIVACAINE HCL 5 MG/ML IJ SOLN
INTRAMUSCULAR | Status: AC
  Filled 2013-05-01: qty 60

## 2013-05-01 MED ORDER — MIDAZOLAM HCL 2 MG/2ML IJ SOLN: 0.5000 mg | Freq: Once | INTRAMUSCULAR | Status: DC | PRN

## 2013-05-01 MED ORDER — LIDOCAINE HCL (CARDIAC) 20 MG/ML IV SOLN
INTRAVENOUS | Status: DC | PRN
  Administered 2013-05-01 (×2): 50 mg via INTRAVENOUS

## 2013-05-01 MED ORDER — DEXTROSE-NACL 5-0.45 % IV SOLN: INTRAVENOUS | Status: DC

## 2013-05-01 MED ORDER — CEFAZOLIN SODIUM-DEXTROSE 2-3 GM-% IV SOLR
INTRAVENOUS | Status: AC
  Filled 2013-05-01: qty 50

## 2013-05-01 MED ORDER — ENOXAPARIN SODIUM 40 MG/0.4ML ~~LOC~~ SOLN
40.0000 mg | SUBCUTANEOUS | Status: DC
  Filled 2013-05-01: qty 0.4

## 2013-05-01 MED ORDER — ALUM & MAG HYDROXIDE-SIMETH 200-200-20 MG/5ML PO SUSP: 30.0000 mL | ORAL | Status: DC | PRN

## 2013-05-01 MED ORDER — MENTHOL 3 MG MT LOZG: 1.0000 | LOZENGE | OROMUCOSAL | Status: DC | PRN

## 2013-05-01 MED ORDER — DEXAMETHASONE SODIUM PHOSPHATE 4 MG/ML IJ SOLN
INTRAMUSCULAR | Status: DC | PRN
  Administered 2013-05-01: 10 mg via INTRAVENOUS

## 2013-05-01 MED ORDER — LACTATED RINGERS IR SOLN
Status: DC | PRN
  Administered 2013-05-01: 3000 mL

## 2013-05-01 MED ORDER — ACETAMINOPHEN 325 MG PO TABS: 650.0000 mg | ORAL_TABLET | ORAL | Status: DC | PRN

## 2013-05-01 MED ORDER — STERILE WATER FOR IRRIGATION IR SOLN
Status: DC | PRN
  Administered 2013-05-01: 1000 mL

## 2013-05-01 MED ORDER — TEMAZEPAM 15 MG PO CAPS: 15.0000 mg | ORAL_CAPSULE | Freq: Every evening | ORAL | Status: DC | PRN

## 2013-05-01 MED ORDER — FENTANYL CITRATE 0.05 MG/ML IJ SOLN
INTRAMUSCULAR | Status: AC
  Administered 2013-05-01: 50 ug via INTRAVENOUS
  Filled 2013-05-01: qty 2

## 2013-05-01 SURGICAL SUPPLY — 64 items
ADH SKN CLS APL DERMABOND .7 (GAUZE/BANDAGES/DRESSINGS) ×2
APL SKNCLS STERI-STRIP NONHPOA (GAUZE/BANDAGES/DRESSINGS) ×2
BAG URINE DRAINAGE (UROLOGICAL SUPPLIES) ×3 IMPLANT
BARRIER ADHS 3X4 INTERCEED (GAUZE/BANDAGES/DRESSINGS) ×3 IMPLANT
BENZOIN TINCTURE PRP APPL 2/3 (GAUZE/BANDAGES/DRESSINGS) ×3 IMPLANT
BRR ADH 4X3 ABS CNTRL BYND (GAUZE/BANDAGES/DRESSINGS) ×2
CATH FOLEY LATEX FREE 14FR (CATHETERS) ×3
CATH FOLEY LF 14FR (CATHETERS) IMPLANT
CHLORAPREP W/TINT 26ML (MISCELLANEOUS) ×3 IMPLANT
CLOTH BEACON ORANGE TIMEOUT ST (SAFETY) ×3 IMPLANT
CONT PATH 16OZ SNAP LID 3702 (MISCELLANEOUS) ×3 IMPLANT
COVER MAYO STAND STRL (DRAPES) ×3 IMPLANT
COVER TABLE BACK 60X90 (DRAPES) ×6 IMPLANT
COVER TIP SHEARS 8 DVNC (MISCELLANEOUS) ×2 IMPLANT
COVER TIP SHEARS 8MM DA VINCI (MISCELLANEOUS) ×1
DECANTER SPIKE VIAL GLASS SM (MISCELLANEOUS) ×3 IMPLANT
DERMABOND ADVANCED (GAUZE/BANDAGES/DRESSINGS) ×1
DERMABOND ADVANCED .7 DNX12 (GAUZE/BANDAGES/DRESSINGS) ×2 IMPLANT
DRAPE HUG U DISPOSABLE (DRAPE) ×3 IMPLANT
DRAPE LG THREE QUARTER DISP (DRAPES) ×6 IMPLANT
DRAPE WARM FLUID 44X44 (DRAPE) ×3 IMPLANT
ELECT REM PT RETURN 9FT ADLT (ELECTROSURGICAL) ×3
ELECTRODE REM PT RTRN 9FT ADLT (ELECTROSURGICAL) ×2 IMPLANT
EVACUATOR SMOKE 8.L (FILTER) ×3 IMPLANT
GAUZE VASELINE 3X9 (GAUZE/BANDAGES/DRESSINGS) IMPLANT
GLOVE BIOGEL PI IND STRL 7.0 (GLOVE) ×4 IMPLANT
GLOVE BIOGEL PI INDICATOR 7.0 (GLOVE) ×2
GLOVE ECLIPSE 6.5 STRL STRAW (GLOVE) ×12 IMPLANT
GOWN STRL REIN XL XLG (GOWN DISPOSABLE) ×18 IMPLANT
KIT ACCESSORY DA VINCI DISP (KITS) ×1
KIT ACCESSORY DVNC DISP (KITS) ×2 IMPLANT
LEGGING LITHOTOMY PAIR STRL (DRAPES) ×3 IMPLANT
NEEDLE INSUFFLATION 120MM (ENDOMECHANICALS) ×3 IMPLANT
OCCLUDER COLPOPNEUMO (BALLOONS) IMPLANT
PACK LAVH (CUSTOM PROCEDURE TRAY) ×3 IMPLANT
PAD PREP 24X48 CUFFED NSTRL (MISCELLANEOUS) ×6 IMPLANT
PLUG CATH AND CAP STER (CATHETERS) ×3 IMPLANT
PROTECTOR NERVE ULNAR (MISCELLANEOUS) ×6 IMPLANT
SET CYSTO W/LG BORE CLAMP LF (SET/KITS/TRAYS/PACK) ×3 IMPLANT
SET IRRIG TUBING LAPAROSCOPIC (IRRIGATION / IRRIGATOR) ×3 IMPLANT
SOLUTION ELECTROLUBE (MISCELLANEOUS) ×3 IMPLANT
STRIP CLOSURE SKIN 1/4X4 (GAUZE/BANDAGES/DRESSINGS) ×3 IMPLANT
SUT VIC AB 0 CT1 27 (SUTURE) ×15
SUT VIC AB 0 CT1 27XBRD ANBCTR (SUTURE) ×10 IMPLANT
SUT VICRYL 0 UR6 27IN ABS (SUTURE) ×3 IMPLANT
SUT VICRYL RAPIDE 4/0 PS 2 (SUTURE) ×6 IMPLANT
SUT VLOC 180 0 9IN  GS21 (SUTURE) ×1
SUT VLOC 180 0 9IN GS21 (SUTURE) IMPLANT
SYR 50ML LL SCALE MARK (SYRINGE) ×3 IMPLANT
SYSTEM CONVERTIBLE TROCAR (TROCAR) IMPLANT
TIP RUMI ORANGE 6.7MMX12CM (TIP) IMPLANT
TIP UTERINE 5.1X6CM LAV DISP (MISCELLANEOUS) IMPLANT
TIP UTERINE 6.7X10CM GRN DISP (MISCELLANEOUS) IMPLANT
TIP UTERINE 6.7X6CM WHT DISP (MISCELLANEOUS) IMPLANT
TIP UTERINE 6.7X8CM BLUE DISP (MISCELLANEOUS) ×1 IMPLANT
TOWEL OR 17X24 6PK STRL BLUE (TOWEL DISPOSABLE) ×6 IMPLANT
TRAY FOLEY CATH 14FR (SET/KITS/TRAYS/PACK) ×3 IMPLANT
TROCAR DILATING TIP 12MM 150MM (ENDOMECHANICALS) ×3 IMPLANT
TROCAR DISP BLADELESS 8 DVNC (TROCAR) ×2 IMPLANT
TROCAR DISP BLADELESS 8MM (TROCAR) ×1
TROCAR XCEL NON-BLD 5MMX100MML (ENDOMECHANICALS) ×3 IMPLANT
TUBING FILTER THERMOFLATOR (ELECTROSURGICAL) ×3 IMPLANT
WARMER LAPAROSCOPE (MISCELLANEOUS) ×3 IMPLANT
WATER STERILE IRR 1000ML POUR (IV SOLUTION) ×9 IMPLANT

## 2013-05-01 NOTE — Transfer of Care (Signed)
Immediate Anesthesia Transfer of Care Note  Patient: Amber Santos  Procedure(s) Performed: Procedure(s): ROBOTIC ASSISTED TOTAL HYSTERECTOMY WITH BILATERAL SALPINGO OOPHORECTOMY (Bilateral) CYSTOSCOPY (N/A)  Patient Location: PACU  Anesthesia Type:General  Level of Consciousness: awake and sedated  Airway & Oxygen Therapy: Patient Spontanous Breathing and Patient connected to nasal cannula oxygen  Post-op Assessment: Report given to PACU RN and Post -op Vital signs reviewed and stable  Post vital signs: Reviewed and stable  Complications: No apparent anesthesia complications

## 2013-05-01 NOTE — Anesthesia Postprocedure Evaluation (Signed)
  Anesthesia Post-op Note  Patient: Amber Santos  Procedure(s) Performed: Procedure(s): ROBOTIC ASSISTED TOTAL HYSTERECTOMY WITH BILATERAL SALPINGO OOPHORECTOMY (Bilateral) CYSTOSCOPY (N/A) Patient is awake and responsive. Pain and nausea are reasonably well controlled. Vital signs are stable and clinically acceptable. Oxygen saturation is clinically acceptable. There are no apparent anesthetic complications at this time. Patient is ready for discharge.

## 2013-05-01 NOTE — Op Note (Signed)
05/01/2013  10:02 AM  PATIENT:  Amber Santos  62 y.o. female  PRE-OPERATIVE DIAGNOSIS:  Hx Breast cancer, BRCA II positive, endometriosis  POST-OPERATIVE DIAGNOSIS:  hx breast cancer,BRCA II positive, endometriosis,endometrial polyp  PROCEDURE:  Procedure(s): ROBOTIC ASSISTED TOTAL HYSTERECTOMY WITH BILATERAL SALPINGO OOPHORECTOMY CYSTOSCOPY  SURGEON:  Kimiya Brunelle SUZANNE  ASSISTANTS: ROMINE, CYNTHIA   ANESTHESIA:   general  ESTIMATED BLOOD LOSS: 50cc  BLOOD ADMINISTERED:none   FLUIDS: 900ccLR  UOP: 100cc concentrated but clear urine  SPECIMEN:  Uterus, cervix, bilateral tubes and ovaries  DISPOSITION OF SPECIMEN:  PATHOLOGY  FINDINGS: scarring in left pelvis from prior endometriosis.  Scarring at the lower uterine segment where prior cesarean sections had been performed.  The left descending colon has adhesions to the sidewall and the left ovary was adhered to the left sidewall.  Normal, atrophic appearing ovaries.  Normal liver edge and stomach edge.  Appendix not seen.    DESCRIPTION OF OPERATION: DESCRIPTION OF OPERATION: Patient is taken to the operating room. She is placed in the supine position. She is a running IV in place. Informed consent was present on the chart. SCDs on her lower extremities and functioning properly. General endotracheal anesthesia was administered by the anesthesia staff without difficulty. Dr. Cristela Blue oversaw case. Once adequate anesthesia was confirmed the legs are placed in the low lithotomy position in [employer] stirrups. The patient was already on a beanbag. Her arms were tucked by the side. The beanbag was inflated to ensure that there would be no movement during the Trendelenburg placement.  Chlor prep was then used to prep the abdomen and Betadine was used to prep the inner thighs, perineum and vagina. Once 3 minutes had past the patient was draped in a normal standard fashion. The legs were lifted to the high lithotomy position. The  cervix was visualized by placing a heavy weighted speculum in the posterior aspect of the vagina and using a curved Deaver retractor to the retract anteriorly. The anterior lip of the cervix was grasped with single-tooth tenaculum.  The cervix sounded to 9 cm. Pratt dilators were used to dilate the cervix up to a #21. A RUMI uterine manipulator was obtained. A #8 disposable tip was placed on the RUMI manipulator as well as a small KOH ring. This was passed through the cervix and the bulb of the disposable tip was inflated with 10 cc of normal saline. There was a good fit of the KOH ring around the cervix. The tenaculum was removed. There is also good manipulation of the uterus. The speculum and retractor were removed as well. A Foley catheter was placed to straight drain. Concentrated urine was noted. Legs were lowered to the low lithotomy position and attention was turned the abdomen.  Ropivacaine mixture (0.5% mixed one-to-one with normal saline) was used anesthetize the skin in the umbilicus.  A Veress needle was obtained.   The abdomen was elevated and the needle was passed directly into the abdomen. The peritoneum was felt as a pop as it was passed with the needle. A syringe of normal saline was attached the needle and aspiration was performed. No blood or fluid was noted. Fluid was injected without difficulty and a second aspiration was performed. No fluid or blood or saline was noted. Fluid dripped easily into the needle. Low flow CO2 gas was attached the needle and the pneumoperitoneum was achieved without difficulty. Once 2.5 liters of gas was in the abdomen the Veress needle was removed and a 12 millimeter  bladed trochar and port were passed directly to the abdomen. The laparoscope was then used to confirm intraperitoneal placement. The pelvis and upper abdomen could be surveyed without difficulty. Locations for the 1 and #2 arm ports could also be visualized. The skin was transilluminated and the skin  was anesthetized with ropivacaine mixture. 8mm skin incisions were made about 10 cm lateral to the umbilicus on each side. Then 8mm nondisposable trocar ports were passed directly into the abdomen. Also on the right lower quadrant a 5 mm skin incision was made after anesthetize the skin with the ropivacaine mixture. A 5 mm non-bladed trocar port were also passed directly into the abdomen. All trochars were removed.  The table was placed on the floor and the patient was placed in Trendelenburg.  The robot was docked in a normal standard fashion to the left of the table. In the #1 arm was placed endoscopic scissors with monopolar cautery attached and then the #2 arm was placed PK [place] with bipolar cautery attached.   Attention was turned to the right side. Ureter was identified.  With uterus on stretch the right IP ligament was serially clamped, cauterized, and incised.  Care was taken to ensure this incision was superior to the ovary and that no ovarian tissue was left. Right round ligament was serially clamped cauterized and incised. The anterior and posterior peritoneum of the inferior leaf of the broad ligament were opened. The scar from prior cesarean section was dissected sharply until the bladder flap could be created. The bladder was taken down below the level of the KOH ring. The right uterine artery skeletonized and then just superior to the KOH ring this vessel was serially clamped, cauterized, and incised.  Attention was turned the left side.  The ureter could not be identified.  The left round ligament was serially clamped, cauterized, and incised.  The peritoneum above the round ligament was opened.  The filmy tissue beneath this incision was dissected.  The ureter was noted the medial side of this incision but deep.  The IP ligament was isolated and then serially clamped, cauterized, and incised.  Once the path of the ureter was noted, the left ovary was dissected with care off of the left  sidewall.  Then the posterior peritoneum was opened down to the level of the left uterosacral ligament.   The remainder of the bladder flap was created using sharp dissection as there was significant scarring on this side.  The uterine artery was clearly visible and the incision was kept above this vessel.  Once the bladder flap was fully created, the left uterine artery skeletonized. Then the left uterine artery, above the level of the KOH ring, was serially clamped cauterized and incised. The uterus was devascularized at this point.  The colpotomy was performed a starting in the midline and using monopolar cautery with an open edge of the scissors. This was carried around a circumferential fashion until the vaginal mucosa was completely incised in the specimen was freed.  The specimen was then delivered to the vagina.  A vaginal occlusive device was used to maintain the pneumoperitoneum  Instruments were changed with a needle cut suture driver placed in arm 1 and a Cobra grasper placed #2. A V. lock suture was passed through the middle port. Starting in the right angle, the cuff was closed incorporating the anterior and posterior vaginal mucosa in each stitch. This was carried across all the way to the left corner and a running fashion. To  stitches were brought back towards the midline and the suture was cut flush with the vagina. The needle was brought out the pelvis. The pelvis was irrigated. All pedicles were inspected. No bleeding was noted. In Interceed was placed across vaginal cuff. Ureters were noted deep in the pelvis to be peristalsing.  At this point the procedure was completed. The instruments were removed. The robot was undocked. The patient was taken out of Trendelenburg positioning. The ports were removed under direct vision of the laparoscope and the pneumoperitoneum was relieved. Several deep breaths were given to the patient's trying to remove gas in the abdomen and finally the midline port  was removed.  The midline port was closed at the fascial level with figure-of-eight suture of #0 Vicryl. The skin was then closed with subcuticular stitches of 3-0 Vicryl. The skin was cleansed Dermabond was applied.  Foley catheter was removed.  A cystoscopy was performed and both ureters were seen shooting blue tinged urine.  (Indigo carmine had been given intravenously at the end of the procedure.)  The dome of the bladder was normal.  No stiches were seen.  Attention was then turned the vagina and the cuff was inspected. Small amount of bleeding on the anterior vagina was noted.  This was hemostatic with monopolar cautery.   The anterior and posterior vaginal mucosa was clearly incorporated in each stitch. Sponge, lap, needle, initially counts were correct x2. Patient tolerated the procedure very well. She was awakened from anesthesia, extubated and taken to recovery in stable condition.    COUNTS:  YES  PLAN OF CARE: Transfer to PACU

## 2013-05-01 NOTE — Progress Notes (Signed)
Day of Surgery Procedure(s) (LRB): ROBOTIC ASSISTED TOTAL HYSTERECTOMY WITH BILATERAL SALPINGO OOPHORECTOMY (Bilateral) CYSTOSCOPY (N/A)  Subjective: Patient reports nausea, mild.  No emesis.  Tolerating liquids.    Objective: I have reviewed patient's vital signs, intake and output and medications.  General: alert and cooperative Resp: clear to auscultation bilaterally Cardio: regular rate and rhythm, S1, S2 normal, no murmur, click, rub or gallop GI: soft, non-tender; bowel sounds normal; no masses,  no organomegaly Extremities: extremities normal, atraumatic, no cyanosis or edema Vaginal Bleeding: none  Assessment: s/p Procedure(s): ROBOTIC ASSISTED TOTAL HYSTERECTOMY WITH BILATERAL SALPINGO OOPHORECTOMY (Bilateral) CYSTOSCOPY (N/A): stable  Plan: Encourage ambulation Advance diet. Hopeful transition to PO pain meds Lovenox tomorrow Hopeful discharge tomorrow.  LOS: 0 days    [name] [facility] 05/01/2013, 4:08 PM

## 2013-05-01 NOTE — Anesthesia Preprocedure Evaluation (Signed)
Anesthesia Evaluation  Patient identified by MRN, date of birth, ID band Patient awake    Reviewed: Allergy & Precautions, H&P , Patient's Chart, lab work & pertinent test results, reviewed documented beta blocker date and time   History of Anesthesia Complications Negative for: history of anesthetic complications  Airway Mallampati: II TM Distance: >3 FB Neck ROM: full    Dental no notable dental hx.    Pulmonary neg pulmonary ROS,  breath sounds clear to auscultation  Pulmonary exam normal       Cardiovascular Exercise Tolerance: Good negative cardio ROS  Rhythm:regular Rate:Normal     Neuro/Psych negative neurological ROS  negative psych ROS   GI/Hepatic negative GI ROS, Neg liver ROS,   Endo/Other  negative endocrine ROS  Renal/GU negative Renal ROS     Musculoskeletal   Abdominal   Peds  Hematology negative hematology ROS (+) anemia ,   Anesthesia Other Findings Anemia     Breast cancer   high grade ductal ca left breast   Reproductive/Obstetrics negative OB ROS                           Anesthesia Physical Anesthesia Plan  ASA: II  Anesthesia Plan: General ETT   Post-op Pain Management:    Induction:   Airway Management Planned:   Additional Equipment:   Intra-op Plan:   Post-operative Plan:   Informed Consent: I have reviewed the patients History and Physical, chart, labs and discussed the procedure including the risks, benefits and alternatives for the proposed anesthesia with the patient or authorized representative who has indicated his/her understanding and acceptance.   Dental Advisory Given  Plan Discussed with: CRNA and Surgeon  Anesthesia Plan Comments:         Anesthesia Quick Evaluation

## 2013-05-01 NOTE — H&P (Signed)
Amber Santos is an 62 y.o. female with increased genetic succeptibility to ovarian cancer due to + BRCA 2 testing.  This was performed after breast cancer diagnosis last year.   She has decided to proceed with removal of ovaries and uterus for definitive treatment.  Patient underwent PUS on 03/24/13 to ensure there were no abnormalities noted.  Findings included 8.6 x 5.3 x 3.9cm uterus with probable 2.0cm endometrial polyp.  Both ovaries appeared atrophic.    Pertinent Gynecological History: Menses: post-menopausal Bleeding: none Contraception: post menopausal status DES exposure: denies Blood transfusions: none Sexually transmitted diseases: no past history Previous GYN Procedures: c/s x 2  Last mammogram: normal Date: 3/14 Last pap: normal Date: 11/13 (neg with neg HR HPV) OB History: G2, P2   Menstrual History: No LMP recorded. Patient is postmenopausal.    Past Medical History  Diagnosis Date  . Anemia   . Breast cancer     high grade ductal ca left breast    Past Surgical History  Procedure Laterality Date  . Cesarean section  1986, 1987  . Anterior cervical decomp/discectomy fusion  01/10/2009    C4-7  . Breast lumpectomy  06/17/2012    left partial mastectomy  . Axillary lymph node biopsy  07/25/12    left    Family History  Problem Relation Age of Onset  . Breast cancer Mother 86  . Cancer Maternal Aunt     breast, pancreatic  . Colon cancer Maternal Uncle   . Breast cancer Maternal Aunt 28  . Colon cancer Maternal Grandmother   . Kidney cancer Maternal Uncle   . Breast cancer Cousin     2 maternal cousins dx in 29s    Social History:  reports that she quit smoking about 28 years ago. She has never used smokeless tobacco. She reports that  drinks alcohol. She reports that she does not use illicit drugs.  Allergies:  Allergies  Allergen Reactions  . Codeine Other (See Comments)    headaches    Prescriptions prior to admission  Medication Sig Dispense  Refill  . Doxylamine Succinate, Sleep, (SLEEP AID PO) Take 1 tablet by mouth at bedtime as needed (insomnia).      . tamoxifen (NOLVADEX) 20 MG tablet Take 1 tablet (20 mg total) by mouth daily.  90 tablet  12  . ibuprofen (ADVIL,MOTRIN) 800 MG tablet Take 1 tablet (800 mg total) by mouth every 8 (eight) hours as needed for pain.  30 tablet  0  . oxyCODONE-acetaminophen (PERCOCET) 5-325 MG per tablet Take 2 tablets by mouth every 4 (four) hours as needed for pain. use only as much as needed to relieve pain  30 tablet  0    Review of Systems  All other systems reviewed and are negative.    Blood pressure 127/77, pulse 63, temperature 98.2 F (36.8 C), temperature source Oral, resp. rate 18, SpO2 99.00%. Physical Exam  Constitutional: She is oriented to person, place, and time. She appears well-developed and well-nourished.  HENT:  Head: Atraumatic.  Neck: Normal range of motion. Neck supple.  Cardiovascular: Normal rate and regular rhythm.   Respiratory: Effort normal and breath sounds normal.  GI: Soft. Bowel sounds are normal.  Musculoskeletal: Normal range of motion.  Neurological: She is alert and oriented to person, place, and time.  Skin: Skin is warm and dry.  Psychiatric: She has a normal mood and affect.    No results found for this or any previous visit (from the  past 24 hour(s)).  No results found.  Assessment/Plan: 63 yo G2P2 MWF with increased genetic susceptibility for ovarian cancer who has elected complete hysterectomy.  Due to h/o endometriosis and 2 prior cesarean sections and no vaginal deliveries, robotic assisted TLH planned.  Pt did have crush injury to leg years ago with DVT and is given pre-op Lovenox.  [name] [facility] 05/01/2013, 6:54 AM

## 2013-05-02 ENCOUNTER — Encounter (HOSPITAL_COMMUNITY): Payer: Self-pay | Admitting: Obstetrics & Gynecology

## 2013-05-02 LAB — CBC: MCHC: 32.7 g/dL (ref 30.0–36.0)

## 2013-05-02 LAB — HEMOGLOBIN AND HEMATOCRIT, BLOOD: Hemoglobin: 10.1 g/dL — ABNORMAL LOW (ref 12.0–15.0)

## 2013-05-02 MED ORDER — OXYCODONE-ACETAMINOPHEN 5-325 MG PO TABS
1.0000 | ORAL_TABLET | ORAL | Status: DC | PRN
Start: 2013-05-02 — End: 2014-01-02

## 2013-05-02 NOTE — Progress Notes (Signed)
1 Day Post-Op Procedure(s) (LRB): ROBOTIC ASSISTED TOTAL HYSTERECTOMY WITH BILATERAL SALPINGO OOPHORECTOMY (Bilateral) CYSTOSCOPY (N/A)  Subjective: Patient reports tolerating PO's.  Pain under good control.  No flatus.  No nausea.  No dizziness with ambulation   Objective: I have reviewed patient's vital signs, intake and output, medications and labs.  General: alert and cooperative Resp: clear to auscultation bilaterally Cardio: regular rate and rhythm, S1, S2 normal, no murmur, click, rub or gallop GI: soft, non-tender; bowel sounds normal; no masses,  no organomegaly Extremities: extremities normal, atraumatic, no cyanosis or edema Vaginal Bleeding: none Inc:  C/D/I  Assessment: s/p Procedure(s): ROBOTIC ASSISTED TOTAL HYSTERECTOMY WITH BILATERAL SALPINGO OOPHORECTOMY (Bilateral) CYSTOSCOPY (N/A): stable and progressing well  Plan: Repeat Hb at noon.  If stable, D/C home.  LOS: 1 day    Amber Santos [facility] 05/02/2013, 7:41 AM

## 2013-05-02 NOTE — Progress Notes (Signed)
Teaching complete  Pt ambulated out  No questions

## 2013-05-03 NOTE — Discharge Summary (Signed)
Physician Discharge Summary  Patient ID: Amber Santos MRN: [id number] DOB/AGE: 60-05-1951 60 y.o.  Admit date: 05/01/2013 Discharge date: 05/03/2013  Admission Diagnoses:  Genetic susceptibility to ovarian cancer, endometriosis hx  Discharge Diagnoses:  Active Problems:   * No active hospital problems. *   Discharged Condition: good  Hospital Course: Admitted through same day surgery.  Taken to OR where robotic assisted TLH/BSO, cystoscopy were performed.  Surgical findings included normal ovaries but significant adhesions of left ovary to sidewall as well as scarring at lower uterine segment due to two prior cesarean sections.  Surgery was uneventful.  Patient did receive pre-op Lovenox due to hx of lower extremity DVT after crush injury.  (I did consult with Dr. Stanford Breed about use of Lovenox and 2 doses were recommended--day of and POD#1).  EBL 50cc.  Foley catheter was removed before leaving OR.  Patient transferred to PACU and then to 3rd floor.  During her post-op recovery, VSS and pt AF.  She was able to transition to oral pain medications and regular diet.  She had good pain control and was able to void without difficulty.  In AM of POD#1, post op hb was 10.4, decreased from 12.1, pre-operatively.  This was a much lower drop than expected due to minimal EBL.  Therefore, second Lovenox dose not given.  Exam was normal.  Repeat Hb in 5 hours was stable at 10.1.  Patient was voiding, walking, having excellent pain control, no nausea, and ready for D/C.  Consults: None  Significant Diagnostic Studies: labs: post ob ob 10.4  Treatments: surgery: robotic assisted TLH/BSO, cystoscopy  Discharge Exam: Blood pressure 100/64, pulse 61, temperature 98.9 F (37.2 C), temperature source Oral, resp. rate 18, SpO2 100.00%. General appearance: alert and cooperative Resp: clear to auscultation bilaterally Cardio: regular rate and rhythm, S1, S2 normal, no murmur, click, rub or gallop GI:  soft, non-tender; bowel sounds normal; no masses,  no organomegaly Pelvic: no bleeding Incision/Wound:clean/dry/intact  Disposition: 01-Home or Self Care       Future Appointments Provider Department Dept Phone   05/08/2013 1:45 PM Annamaria Boots, MD [facility] [phone]   05/25/2013 2:30 PM [facility] [phone]   05/29/2013 12:45 PM Annamaria Boots, MD [facility] [phone]   06/01/2013 3:00 PM Lowella Dell, MD [place] [facility] [phone]       Medication List         ibuprofen 800 MG tablet  Commonly known as:  ADVIL,MOTRIN  Take 1 tablet (800 mg total) by mouth every 8 (eight) hours as needed for pain.     oxyCODONE-acetaminophen 5-325 MG per tablet  Commonly known as:  PERCOCET/ROXICET  Take 1-2 tablets by mouth every 4 (four) hours as needed.     SLEEP AID PO  Take 1 tablet by mouth at bedtime as needed (insomnia).     tamoxifen 20 MG tablet  Commonly known as:  NOLVADEX  Take 1 tablet (20 mg total) by mouth daily.       Follow-up Information   Follow up with Annamaria Boots, MD On 05/08/2013. (appt time 1:45pm)    Contact information:   [address] [place] [address] [phone]       Signed: Annamaria Boots 05/03/2013, 9:47 PM

## 2013-05-03 NOTE — Discharge Summary (Signed)
Physician Discharge Summary  Patient ID: Amber Santos MRN: [id number] DOB/AGE: 1951/04/01 62 y.o.  Admit date: 05/01/2013 Discharge date: 05/03/2013  Admission Diagnoses:  Genetic susceptibility to ovarian cancer, endometriosis hx  Discharge Diagnoses:  Active Problems:   * No active hospital problems. *   Discharged Condition: good  Hospital Course: Admitted through same day surgery.  Taken to OR where robotic assisted TLH/BSO, cystoscopy were performed.  Surgical findings included normal ovaries but significant adhesions of left ovary to sidewall as well as scarring at lower uterine segment due to two prior cesarean sections.  Surgery was uneventful.  Patient did receive pre-op Lovenox due to hx of lower extremity DVT after crush injury.  (I did consult with Dr. Stanford Breed about use of Lovenox and 2 doses were recommended--day of and POD#1).  EBL 50cc.  Foley catheter was removed before leaving OR.  Patient transferred to PACU and then to 3rd floor.  During her post-op recovery, VSS and pt AF.  She was able to transition to oral pain medications and regular diet.  She had good pain control and was able to void without difficulty.  In AM of POD#1, post op hb was 10.4, decreased from 12.1, pre-operatively.  This was a much lower drop than expected due to minimal EBL.  Therefore, second Lovenox dose not given.  Exam was normal.  Repeat Hb in 5 hours was stable at 10.1.  Patient was voiding, walking, having excellent pain control, no nausea, and ready for D/C.  Consults: None  Significant Diagnostic Studies: labs: post ob ob 10.4  Treatments: surgery: robotic assisted TLH/BSO, cystoscopy  Discharge Exam: Blood pressure 100/64, pulse 61, temperature 98.9 F (37.2 C), temperature source Oral, resp. rate 18, SpO2 100.00%. General appearance: alert and cooperative Resp: clear to auscultation bilaterally Cardio: regular rate and rhythm, S1, S2 normal, no murmur, click, rub or gallop GI:  soft, non-tender; bowel sounds normal; no masses,  no organomegaly Pelvic: no bleeding Incision/Wound:clean/dry/intact  Disposition: 01-Home or Self Care   Future Appointments Provider Department Dept Phone   05/08/2013 1:45 PM Annamaria Boots, MD [facility] [phone]   05/25/2013 2:30 PM [facility] [phone]   05/29/2013 12:45 PM Annamaria Boots, MD [facility] [phone]   06/01/2013 3:00 PM Lowella Dell, MD [place] [facility] [phone]       Medication List         ibuprofen 800 MG tablet  Commonly known as:  ADVIL,MOTRIN  Take 1 tablet (800 mg total) by mouth every 8 (eight) hours as needed for pain.     oxyCODONE-acetaminophen 5-325 MG per tablet  Commonly known as:  PERCOCET/ROXICET  Take 1-2 tablets by mouth every 4 (four) hours as needed.     SLEEP AID PO  Take 1 tablet by mouth at bedtime as needed (insomnia).     tamoxifen 20 MG tablet  Commonly known as:  NOLVADEX  Take 1 tablet (20 mg total) by mouth daily.           Follow-up Information   Follow up with Annamaria Boots, MD On 05/08/2013. (appt time 1:45pm)    Contact information:   [address] [place] [address] [phone]       Signed: Annamaria Boots 05/03/2013, 9:38 PM

## 2013-05-05 ENCOUNTER — Telehealth: Payer: Self-pay | Admitting: Obstetrics & Gynecology

## 2013-05-05 NOTE — Telephone Encounter (Signed)
Patient call again to find out he results of her hysterectomy.

## 2013-05-05 NOTE — Telephone Encounter (Signed)
Patient called to find out the results of her hysterectomy ( path report). She missed your call last night . Please call back . Thank you.

## 2013-05-08 ENCOUNTER — Encounter: Payer: Self-pay | Admitting: Obstetrics & Gynecology

## 2013-05-08 ENCOUNTER — Ambulatory Visit (INDEPENDENT_AMBULATORY_CARE_PROVIDER_SITE_OTHER): Payer: BC Managed Care – PPO | Admitting: Obstetrics & Gynecology

## 2013-05-08 VITALS — BP 120/68 | HR 60 | Resp 16 | Ht 67.75 in | Wt 199.2 lb

## 2013-05-08 DIAGNOSIS — Z9889 Other specified postprocedural states: Secondary | ICD-10-CM

## 2013-05-08 NOTE — Patient Instructions (Signed)
Call if anything changes

## 2013-05-08 NOTE — Progress Notes (Signed)
Post Operative Visit  Procedure:Robotic TLH/BSO/cystoscopy Days Post-op: 7 days  Subjective: Doing well.  No nausea.  No vaginal bleeding.  Feeling some pressure.  Objective: BP 120/68  Pulse 60  Resp 16  Ht 5' 7.75" (1.721 m)  Wt 199 lb 3.2 oz (90.357 kg)  BMI 30.51 kg/m2  EXAM General: alert and cooperative Resp: clear to auscultation bilaterally Cardio: regular rate and rhythm, S1, S2 normal, no murmur, click, rub or gallop GI: soft, non-tender; bowel sounds normal; no masses,  no organomegaly and incision: clean, dry and intact Extremities: extremities normal, atraumatic, no cyanosis or edema Vaginal Bleeding: none, cuff well healed  Assessment: s/p TLH/BSO/cysto  Plan: Recheck 3 weeks

## 2013-05-09 ENCOUNTER — Ambulatory Visit: Payer: Self-pay | Admitting: Obstetrics & Gynecology

## 2013-05-15 ENCOUNTER — Other Ambulatory Visit (INDEPENDENT_AMBULATORY_CARE_PROVIDER_SITE_OTHER): Payer: Self-pay | Admitting: General Surgery

## 2013-05-15 DIAGNOSIS — Z853 Personal history of malignant neoplasm of breast: Secondary | ICD-10-CM

## 2013-05-18 ENCOUNTER — Encounter: Payer: Self-pay | Admitting: Obstetrics & Gynecology

## 2013-05-23 NOTE — Telephone Encounter (Signed)
Please advise 

## 2013-05-23 NOTE — Telephone Encounter (Signed)
Reviewed pathology at one week post op.  Close encounter.

## 2013-05-25 ENCOUNTER — Other Ambulatory Visit (HOSPITAL_BASED_OUTPATIENT_CLINIC_OR_DEPARTMENT_OTHER): Payer: BC Managed Care – PPO

## 2013-05-25 ENCOUNTER — Other Ambulatory Visit: Payer: BC Managed Care – PPO | Admitting: Lab

## 2013-05-25 DIAGNOSIS — C50412 Malignant neoplasm of upper-outer quadrant of left female breast: Secondary | ICD-10-CM

## 2013-05-25 DIAGNOSIS — C50419 Malignant neoplasm of upper-outer quadrant of unspecified female breast: Secondary | ICD-10-CM

## 2013-05-25 LAB — CBC WITH DIFFERENTIAL/PLATELET
BASO%: 0.6 % (ref 0.0–2.0)
LYMPH%: 28.4 % (ref 14.0–49.7)
MCHC: 33.3 g/dL (ref 31.5–36.0)
MCV: 83.1 fL (ref 79.5–101.0)
MONO%: 11.5 % (ref 0.0–14.0)
Platelets: 219 10*3/uL (ref 145–400)
RBC: 4.3 10*6/uL (ref 3.70–5.45)
RDW: 12.9 % (ref 11.2–14.5)
WBC: 4.8 10*3/uL (ref 3.9–10.3)

## 2013-05-25 LAB — COMPREHENSIVE METABOLIC PANEL (CC13)
ALT: 24 U/L (ref 0–55)
AST: 25 U/L (ref 5–34)
Alkaline Phosphatase: 47 U/L (ref 40–150)
Sodium: 139 mEq/L (ref 136–145)
Total Bilirubin: 0.45 mg/dL (ref 0.20–1.20)
Total Protein: 7.1 g/dL (ref 6.4–8.3)

## 2013-05-29 ENCOUNTER — Ambulatory Visit (INDEPENDENT_AMBULATORY_CARE_PROVIDER_SITE_OTHER): Payer: BC Managed Care – PPO | Admitting: Obstetrics & Gynecology

## 2013-05-29 ENCOUNTER — Encounter: Payer: Self-pay | Admitting: Obstetrics & Gynecology

## 2013-05-29 VITALS — BP 120/78 | HR 60 | Resp 16 | Ht 67.75 in | Wt 200.8 lb

## 2013-05-29 DIAGNOSIS — Z9889 Other specified postprocedural states: Secondary | ICD-10-CM

## 2013-05-29 NOTE — Progress Notes (Signed)
Post Operative Visit  Procedure:Robotic TLH/BSO Days Post-op: 29 days  Subjective: Doing really well.  No vaginal bleeding.  Energy is good.  Having some constipation.  Having some rectal bleeding with this.    Objective: BP 120/78  Pulse 60  Resp 16  Ht 5' 7.75" (1.721 m)  Wt 200 lb 12.8 oz (91.082 kg)  BMI 30.75 kg/m2  EXAM General: alert and cooperative Resp: clear to auscultation bilaterally Cardio: regular rate and rhythm, S1, S2 normal, no murmur, click, rub or gallop GI: soft, non-tender; bowel sounds normal; no masses,  no organomegaly Extremities: extremities normal, atraumatic, no cyanosis or edema Vaginal Bleeding: none, Cuff healing well.    Assessment: s/p Robotic assisted TLH/BSO  Plan: 1 yr follow-up

## 2013-05-29 NOTE — Patient Instructions (Signed)
Please call if you need anything

## 2013-05-30 ENCOUNTER — Ambulatory Visit
Admission: RE | Admit: 2013-05-30 | Discharge: 2013-05-30 | Disposition: A | Discharge status: Home / Self Care | Payer: BC Managed Care – PPO | Source: Ambulatory Visit | Attending: General Surgery | Admitting: General Surgery

## 2013-05-30 DIAGNOSIS — Z853 Personal history of malignant neoplasm of breast: Secondary | ICD-10-CM

## 2013-06-01 ENCOUNTER — Telehealth: Payer: Self-pay | Admitting: *Deleted

## 2013-06-01 ENCOUNTER — Ambulatory Visit (HOSPITAL_BASED_OUTPATIENT_CLINIC_OR_DEPARTMENT_OTHER): Payer: BC Managed Care – PPO | Admitting: Oncology

## 2013-06-01 VITALS — BP 106/73 | HR 76 | Temp 98.4°F | Resp 20 | Ht 67.75 in | Wt 199.6 lb

## 2013-06-01 DIAGNOSIS — C50419 Malignant neoplasm of upper-outer quadrant of unspecified female breast: Secondary | ICD-10-CM

## 2013-06-01 NOTE — Telephone Encounter (Signed)
appts made and printed...td 

## 2013-06-01 NOTE — Progress Notes (Signed)
ID: Amber Santos   DOB: [birth year]  MR#: [id number]  CSN#:[id number]  PCP: Delorse Lek, MD GYN:  SUClaud Kelp MD OTHER MD: Hal Neer   HISTORY OF PRESENT ILLNESS: Amber Santos had routine screening mammography at [facility] 05/23/2022 showing new calcifications in the left breast. Diagnostic left mammography 05/27/2012 confirmed a cluster of faint calcifications in the upper outer quadrant. Biopsy was performed 06/01/2012, and showed atypical ductal hyperplasia ([id number]). Accordingly the patient underwent left lumpectomy 06/17/2012. This showed ([id number]) invasive ductal carcinoma, grade 2, measuring 1.4 cm, with associated high-grade ductal carcinoma in situ. There were 2 additional areas of ductal carcinoma in situ, but margins were clear. The invasive tumor was 100% estrogen receptor and 100% progesterone receptor positive, with an MIB-1 of 20%. It was HER-2 negative.   On 06/26/2012 the patient underwent bilateral breast MRI. This found a seroma in the upper outer quadrant of the left breast measuring 6 cm. There was no other area of suspicious enhancement in the left breast, no findings of concern in the right breast, and no axillary or internal mammary adenopathy. The patient's subsequent history is as detailed below  INTERVAL HISTORY: Amber Santos returns today for followup of her breast cancer. Since her last visit here she had her total abdominal hysterectomy and bilateral salpingo-oophorectomy. She did fine with the surgery, with no unusual bleeding, pain, fever, or other complications. In the interim also her husband Zella Ball has had his prostate surgery.  REVIEW OF SYSTEMS: She is retiring September 30. 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 Her mother in a mobile [place] says is getting a little bit confused but still drives. Amber Santos is going to be going down there and spending some time with her. She  feels she's not sleeping well, she is having some hearing loss, and occasionally she has pain in the surgical breast. She is having some hot flashes. A detailed review of systems today was otherwise noncontributory.  PAST MEDICAL HISTORY: Past Medical History  Diagnosis Date  . Anemia   . Breast cancer     high grade ductal ca left breast    PAST SURGICAL HISTORY: Past Surgical History  Procedure Laterality Date  . Cesarean section  1986, 1987  . Anterior cervical decomp/discectomy fusion  01/10/2009    C4-7  . Breast lumpectomy  06/17/2012    left partial mastectomy  . Axillary lymph node biopsy  07/25/12    left  . Robotic assisted total hysterectomy with bilateral salpingo oopherectomy Bilateral 05/01/2013    Procedure: ROBOTIC ASSISTED TOTAL HYSTERECTOMY WITH BILATERAL SALPINGO OOPHORECTOMY;  Surgeon: Annamaria Boots, MD;  Location: [facility];  Service: Gynecology;  Laterality: Bilateral;  . Cystoscopy N/A 05/01/2013    Procedure: CYSTOSCOPY;  Surgeon: Annamaria Boots, MD;  Location: [facility];  Service: Gynecology;  Laterality: N/A;    FAMILY HISTORY Family History  Problem Relation Age of Onset  . Breast cancer Mother 29  . Cancer Maternal Aunt     breast, pancreatic  . Colon cancer Maternal Uncle   . Breast cancer Maternal Aunt 28  . Colon cancer Maternal Grandmother   . Kidney cancer Maternal Uncle   . Breast cancer Cousin     2 maternal cousins dx in 20s   the patient's father died at the age of 67 from an accident. The patient's mother is living, currently 65 years old. She has a history of breast cancer,  postmenopausal. The patient has one brother, no sisters. In addition the patient has a maternal aunt who was diagnosed with breast cancer at age 50 and a cousin who was diagnosed with breast cancer at age 73. She'll says 2 maternal uncles with a history of colon cancer.  GYNECOLOGIC HISTORY: Menarche age 73, first live birth age 7, which of course increases the risk  of breast cancer. She is GX P2. 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 She is very active in a light Her husband Zella Ball is V.P. at [employer]. He had surgery for prostate cancer in 2014 Son [name] lives in [place] and son [name] lives in [place]. Both work for [employer]   ADVANCED DIRECTIVES: In place  HEALTH MAINTENANCE: History  Substance Use Topics  . Smoking status: Former Smoker -- 0.50 packs/day for 12 years    Quit date: 06/17/1984  . Smokeless tobacco: Never Used  . Alcohol Use: Yes     Comment: weekends, events     Colonoscopy: 2005?  PAP: 2012  Bone density: [facility]12[facility]  Lipid panel:  Allergies  Allergen Reactions  . Codeine Other (See Comments)    headaches    Current Outpatient Prescriptions  Medication Sig Dispense Refill  . Docusate Calcium (STOOL SOFTENER PO) Take by mouth daily.      . Doxylamine Succinate, Sleep, (SLEEP AID PO) Take 1 tablet by mouth at bedtime as needed (insomnia).      Marland Kitchen ibuprofen (ADVIL,MOTRIN) 800 MG tablet Take 1 tablet (800 mg total) by mouth every 8 (eight) hours as needed for pain.  30 tablet  0  . oxyCODONE-acetaminophen (PERCOCET/ROXICET) 5-325 MG per tablet Take 1-2 tablets by mouth every 4 (four) hours as needed.  30 tablet  0  . tamoxifen (NOLVADEX) 20 MG tablet Take 1 tablet (20 mg total) by mouth daily.  90 tablet  12   No current facility-administered medications for this visit.    OBJECTIVE: Middle-aged white woman in no acute distress Filed Vitals:   06/01/13 1508  BP: 106/73  Pulse: 76  Temp: 98.4 F (36.9 C)  Resp: 20     Body mass index is 30.57 kg/(m^2).    ECOG FS: 0  Sclerae unicteric Oropharynx clear No cervical or supraclavicular adenopathy Lungs no rales or rhonchi Heart regular rate and rhythm Abd benign MSK no focal spinal  tenderness, no peripheral edema Neuro: nonfocal Breasts: The right breast is unremarkable. The left breast is status post lumpectomy and radiation. There is no evidence of local recurrence. The left axilla is benign.    LAB RESULTS: Lab Results  Component Value Date   WBC 4.8 05/25/2013   NEUTROABS 2.8 05/25/2013   HGB 11.9 05/25/2013   HCT 35.8 05/25/2013   MCV 83.1 05/25/2013   PLT 219 05/25/2013      Chemistry      Component Value Date/Time   NA 139 05/25/2013 1554      Component Value Date/Time   CALCIUM 9.0 05/25/2013 1554       No results found for this basename: LABCA2    No components found with this basename: LABCA125    No results found for this basename: INR,  in the last 168 hours  Urinalysis No results found for this basename: colorurine,  appearanceur,  labspec,  phurine,  glucoseu,  hgbur,  bilirubinur,  ketonesur,  proteinur,  urobilinogen,  nitrite,  leukocytesur    STUDIES: No results found.    ASSESSMENT: 62 y.o. BRCA-2 positive Amber Santos woman status post left lumpectomy with no axillary lymph node sampling 06/17/2012 for a pT1c pNX, clinical stage IA invasive ductal carcinoma, grade 2, 100% estrogen and 100% progesterone receptor positive, HER-2 negative, with an MIB-1 of 16%.  (1) left sentinel lymph node biopsy 07/25/2012 showed 0 of 3 lymph nodes positive  (2) Oncotype DX shows a recurrence score of 21, predicting a distant recurrence risk within 10 years of 14% if the patient's only systemic treatment is tamoxifen for 5 years  (3) adjuvant radiation therapy completed 10/03/2012  (4) tamoxifen started January 2014  (5) s/p TAH-BSO 05/01/2013  PLAN: Amber Santos is doing very well as far as her breast cancer is concerned. She is tolerating the tamoxifen without any great difficulty and the plan will be to continue that at least for 2 years before switching considering switching to an aromatase inhibitor. Of course we also have the option of  continuing tamoxifen for 10 years. She is going to see me again in February of 2015. She doesn't actually have an active primary care physician's order and a check her lipid panel before that visit.   She has a cousin who has been tested for BRCA, results pending. We talked about testing her sons, and I think in her late 46s might be a good time for them to consider that test. They are at any rate and risk for prostate cancer, as well as to a lesser extent breast cancer.   Makenli is a good understanding of the overall plan. She knows to call for any problems that may develop before her next visit here     Ashland Osmer C    06/01/2013

## 2013-07-24 ENCOUNTER — Telehealth: Payer: Self-pay | Admitting: Obstetrics & Gynecology

## 2013-07-24 NOTE — Telephone Encounter (Signed)
Amber Santos, were you able to help this patient with this?

## 2013-07-24 NOTE — Telephone Encounter (Signed)
Patient brought in paperwork for [place] to sumbit her claim for her hysterectomy to [employer] for her cancer benefits. She contacted [employer] and they never received the paperwork. Please advise.

## 2013-08-07 ENCOUNTER — Encounter: Payer: Self-pay | Admitting: Genetic Counselor

## 2013-08-07 ENCOUNTER — Telehealth: Payer: Self-pay | Admitting: Genetic Counselor

## 2013-08-07 DIAGNOSIS — Z1501 Genetic susceptibility to malignant neoplasm of breast: Secondary | ICD-10-CM | POA: Insufficient documentation

## 2013-08-07 DIAGNOSIS — Z1509 Genetic susceptibility to other malignant neoplasm: Secondary | ICD-10-CM

## 2013-08-07 DIAGNOSIS — Z1589 Genetic susceptibility to other disease: Secondary | ICD-10-CM | POA: Insufficient documentation

## 2013-08-07 NOTE — Telephone Encounter (Signed)
Patient never received a copy of the report and needs one to send to her relatives.  She would like it emailed.  Discussed that I will email, with the understanding that email is not secure.  She is aware and would like it emailed anyway.  Emailed it to the patient.

## 2013-08-10 ENCOUNTER — Other Ambulatory Visit: Payer: Self-pay

## 2013-09-07 ENCOUNTER — Telehealth (INDEPENDENT_AMBULATORY_CARE_PROVIDER_SITE_OTHER): Payer: Self-pay

## 2013-09-07 NOTE — Telephone Encounter (Signed)
Patient was called 11/26 by front office staff to schedule long term follow up with Dr [name] in January.  She states she doesn't need to see him because she sees her oncologist.

## 2013-11-27 ENCOUNTER — Other Ambulatory Visit (HOSPITAL_BASED_OUTPATIENT_CLINIC_OR_DEPARTMENT_OTHER): Payer: BC Managed Care – PPO

## 2013-11-27 DIAGNOSIS — C50419 Malignant neoplasm of upper-outer quadrant of unspecified female breast: Secondary | ICD-10-CM

## 2013-11-27 LAB — COMPREHENSIVE METABOLIC PANEL (CC13)
ALBUMIN: 3.7 g/dL (ref 3.5–5.0)
ALT: 26 U/L (ref 0–55)
ANION GAP: 8 meq/L (ref 3–11)
AST: 34 U/L (ref 5–34)
Alkaline Phosphatase: 44 U/L (ref 40–150)
BUN: 20.8 mg/dL (ref 7.0–26.0)
CALCIUM: 9.2 mg/dL (ref 8.4–10.4)
CHLORIDE: 108 meq/L (ref 98–109)
CO2: 26 meq/L (ref 22–29)
CREATININE: 0.7 mg/dL (ref 0.6–1.1)
Glucose: 107 mg/dl (ref 70–140)
POTASSIUM: 4.1 meq/L (ref 3.5–5.1)
Sodium: 142 mEq/L (ref 136–145)
Total Bilirubin: 0.41 mg/dL (ref 0.20–1.20)
Total Protein: 7 g/dL (ref 6.4–8.3)

## 2013-11-27 LAB — CBC WITH DIFFERENTIAL/PLATELET
BASO%: 0.6 % (ref 0.0–2.0)
BASOS ABS: 0 10*3/uL (ref 0.0–0.1)
EOS%: 2.9 % (ref 0.0–7.0)
Eosinophils Absolute: 0.1 10*3/uL (ref 0.0–0.5)
HEMATOCRIT: 37.2 % (ref 34.8–46.6)
HEMOGLOBIN: 12.1 g/dL (ref 11.6–15.9)
LYMPH#: 1 10*3/uL (ref 0.9–3.3)
LYMPH%: 30.9 % (ref 14.0–49.7)
MCH: 27.4 pg (ref 25.1–34.0)
MCHC: 32.7 g/dL (ref 31.5–36.0)
MCV: 84 fL (ref 79.5–101.0)
MONO#: 0.5 10*3/uL (ref 0.1–0.9)
MONO%: 14.4 % — ABNORMAL HIGH (ref 0.0–14.0)
NEUT%: 51.2 % (ref 38.4–76.8)
NEUTROS ABS: 1.7 10*3/uL (ref 1.5–6.5)
Platelets: 198 10*3/uL (ref 145–400)
RBC: 4.43 10*6/uL (ref 3.70–5.45)
RDW: 13 % (ref 11.2–14.5)
WBC: 3.3 10*3/uL — AB (ref 3.9–10.3)

## 2013-11-27 LAB — LIPID PANEL
Cholesterol: 201 mg/dL — ABNORMAL HIGH (ref 0–200)
HDL: 77 mg/dL (ref >39)
LDL CALC: 111 mg/dL — AB (ref 0–99)
TRIGLYCERIDES: 65 mg/dL (ref <150)
Total CHOL/HDL Ratio: 2.6 Ratio
VLDL: 13 mg/dL (ref 0–40)

## 2013-12-04 ENCOUNTER — Telehealth: Payer: Self-pay | Admitting: *Deleted

## 2013-12-04 ENCOUNTER — Encounter: Payer: Self-pay | Admitting: Internal Medicine

## 2013-12-04 ENCOUNTER — Telehealth: Payer: Self-pay | Admitting: Oncology

## 2013-12-04 ENCOUNTER — Ambulatory Visit (HOSPITAL_BASED_OUTPATIENT_CLINIC_OR_DEPARTMENT_OTHER): Payer: BC Managed Care – PPO | Admitting: Oncology

## 2013-12-04 VITALS — BP 136/85 | HR 65 | Temp 98.1°F | Resp 18 | Ht 67.75 in | Wt 206.4 lb

## 2013-12-04 DIAGNOSIS — Z170 Estrogen receptor positive status [ER+]: Secondary | ICD-10-CM

## 2013-12-04 DIAGNOSIS — C50419 Malignant neoplasm of upper-outer quadrant of unspecified female breast: Secondary | ICD-10-CM

## 2013-12-04 DIAGNOSIS — R002 Palpitations: Secondary | ICD-10-CM

## 2013-12-04 DIAGNOSIS — R61 Generalized hyperhidrosis: Secondary | ICD-10-CM

## 2013-12-04 DIAGNOSIS — N899 Noninflammatory disorder of vagina, unspecified: Secondary | ICD-10-CM

## 2013-12-04 MED ORDER — TAMOXIFEN CITRATE 20 MG PO TABS: 20.0000 mg | ORAL_TABLET | Freq: Every day | ORAL | Status: DC

## 2013-12-04 MED ORDER — GABAPENTIN 300 MG PO CAPS: 300.0000 mg | ORAL_CAPSULE | Freq: Every day | ORAL | Status: DC

## 2013-12-04 NOTE — Telephone Encounter (Signed)
Requested GI procedures to be scanned.

## 2013-12-04 NOTE — Telephone Encounter (Signed)
, °

## 2013-12-04 NOTE — Telephone Encounter (Signed)
Message copied by MILLER, REGINA N on Mon Dec 04, 2013 12:56 PM ------      Message from: BRODIE, DORA M      Created: Mon Dec 04, 2013 12:17 PM       Gus, thank you for letting me know.  We will reviewed her paper chart as to her last colonoscopy and recall.      Regina, please , order  Paper chart for me to review. THANX  DB      ----- Message -----         From: Gustav C Magrinat, MD         Sent: 12/04/2013   9:04 AM           To: Dora M Brodie, MD            Dora, Amber Santos had her last colonoscopy about 10 years ago-- there is a significant family history of colon cancer and of course she is BRCA positive-- wonder if she's "due"            Thanks!            GM       ------ 

## 2013-12-04 NOTE — Progress Notes (Signed)
ID: Amber Santos   DOB: 01/03/1951  MR#: [id number]  CSN#:[id number]  PCP: Stephens Shire, MD GYN:  SUFanny Skates MD OTHER MD: Lance Morin, Delfin Edis   HISTORY OF BREAST CANCER: Amandeep had routine screening mammography at [facility] 05/23/2022 showing new calcifications in the left breast. Diagnostic left mammography 05/27/2012 confirmed a cluster of faint calcifications in the upper outer quadrant. Biopsy was performed 06/01/2012, and showed atypical ductal hyperplasia ([id number]). Accordingly the patient underwent left lumpectomy 06/17/2012. This showed ([id number]) invasive ductal carcinoma, grade 2, measuring 1.4 cm, with associated high-grade ductal carcinoma in situ. There were 2 additional areas of ductal carcinoma in situ, but margins were clear. The invasive tumor was 100% estrogen receptor and 100% progesterone receptor positive, with an MIB-1 of 20%. It was HER-2 negative.   On 06/26/2012 the patient underwent bilateral breast MRI. This found a seroma in the upper outer quadrant of the left breast measuring 6 cm. There was no other area of suspicious enhancement in the left breast, no findings of concern in the right breast, and no axillary or internal mammary adenopathy. The patient's subsequent history is as detailed below  INTERVAL HISTORY: Amber Santos returns today for followup of her breast cancer. Since her last visit here her cousin in doing has been tested for BRCA and found to be positive. She is followed at [facility]. Also the patient's mother has developed worsening Alzheimer's disease. I suggested [facility] check "the 36 hour day", which ought to be very helpful to her through this transition period  REVIEW OF SYSTEMS: Amber Santos is volunteering here to weekly and enjoying it. She feels she is losing her hearing a little bit. Just some tinnitus. She has noted an irregular heartbeat, usually when she is driving (her mother lives on the [place] is doing a lot of  driving) or when sitting, or other times. She doesn't seem to notice that when she is exercising (which she does regularly and vigorously). The palpitations are not associated with shortness of breath, chest pain or pressure, or presyncopal symptoms. She is having some vaginal wetness and significant hot flashes likely due to the tamoxifen. These do wake her up at night. Otherwise a detailed review of systems today was noncontributory  PAST MEDICAL HISTORY: Past Medical History  Diagnosis Date  . Anemia   . Breast cancer     high grade ductal ca left breast    PAST SURGICAL HISTORY: Past Surgical History  Procedure Laterality Date  . Cesarean section  1986, 1987  . Anterior cervical decomp/discectomy fusion  01/10/2009    C4-7  . Breast lumpectomy  06/17/2012    left partial mastectomy  . Axillary lymph node biopsy  07/25/12    left  . Robotic assisted total hysterectomy with bilateral salpingo oopherectomy Bilateral 05/01/2013    Procedure: ROBOTIC ASSISTED TOTAL HYSTERECTOMY WITH BILATERAL SALPINGO OOPHORECTOMY;  Surgeon: Lyman Speller, MD;  Location: [facility];  Service: Gynecology;  Laterality: Bilateral;  . Cystoscopy N/A 05/01/2013    Procedure: CYSTOSCOPY;  Surgeon: Lyman Speller, MD;  Location: [facility];  Service: Gynecology;  Laterality: N/A;    FAMILY HISTORY Family History  Problem Relation Age of Onset  . Breast cancer Mother 25  . Cancer Maternal Aunt     breast, pancreatic  . Colon cancer Maternal Uncle   . Breast cancer Maternal Aunt 28  . Colon cancer Maternal Grandmother   . Kidney cancer Maternal Uncle   . Breast cancer Cousin  2 maternal cousins dx in 19s   the patient's father died at the age of 84 from an accident. The patient's mother is living, currently 82 years old. She has a history of breast cancer, postmenopausal. The patient has one brother, no sisters. In addition the patient has a maternal aunt who was diagnosed with breast cancer at age 31  and a cousin who was diagnosed with breast cancer at age 34 (also found to be BRCA positive, followed at [facility]). She says 2 maternal uncles had a history of colon cancer.  GYNECOLOGIC HISTORY: Menarche age 51, first live birth age 63, which of course increases the risk of breast cancer. She is GX P2. 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 She is very active in a light Her husband Amber Santos is V.P. at [employer]. He had surgery for prostate cancer in 2014 Amber Santos lives in [place] and Amber Amber Santos lives in [place]. Both work for [employer]: In place  HEALTH MAINTENANCE: History  Substance Use Topics  . Smoking status: Former Smoker -- 0.50 packs/day for 12 years    Quit date: 06/17/1984  . Smokeless tobacco: Never Used  . Alcohol Use: Yes     Comment: weekends, events     Colonoscopy: 2005?  PAP: 2012  Bone density: [facility]/12/[facility]/ [facility]  Lipid panel:  Allergies  Allergen Reactions  . Codeine Other (See Comments)    headaches    Current Outpatient Prescriptions  Medication Sig Dispense Refill  . Docusate Calcium (STOOL SOFTENER PO) Take by mouth daily.      . Doxylamine Succinate, Sleep, (SLEEP AID PO) Take 1 tablet by mouth at bedtime as needed (insomnia).      Marland Kitchen ibuprofen (ADVIL,MOTRIN) 800 MG tablet Take 1 tablet (800 mg total) by mouth every 8 (eight) hours as needed for pain.  30 tablet  0  . oxyCODONE-acetaminophen (PERCOCET/ROXICET) 5-325 MG per tablet Take 1-2 tablets by mouth every 4 (four) hours as needed.  30 tablet  0  . tamoxifen (NOLVADEX) 20 MG tablet Take 1 tablet (20 mg total) by mouth daily.  90 tablet  12   No current facility-administered medications for this visit.    OBJECTIVE: Middle-aged white woman who appears well Filed Vitals:   12/04/13 0821  BP: 136/85  Pulse:  65  Temp: 98.1 F (36.7 C)  Resp: 18     Body mass index is 31.61 kg/(m^2).    ECOG FS: 0  Sclerae unicteric, pupils equal and reactive Oropharynx clear and moist No cervical or supraclavicular adenopathy Lungs no rales or rhonchi Heart regular rate and rhythm Abd soft, nontender, positive bowel sounds MSK no focal spinal tenderness, no upper extremity lymphedema Neuro: nonfocal, well oriented, appropriate affect Breasts: The right breast is unremarkable. The left breast is status post lumpectomy and radiation. There is no evidence of local recurrence. The left axilla is benign.    LAB RESULTS: Lab Results  Component Value Date   WBC 3.3* 11/27/2013   NEUTROABS 1.7 11/27/2013   HGB 12.1 11/27/2013   HCT 37.2 11/27/2013   MCV 84.0 11/27/2013   PLT 198 11/27/2013      Chemistry      Component Value Date/Time   NA 142 11/27/2013 0815      Component Value Date/Time   CALCIUM 9.2 11/27/2013 0815  No results found for this basename: LABCA2    No components found with this basename: LABCA125    No results found for this basename: INR,  in the last 168 hours  Urinalysis No results found for this basename: colorurine,  appearanceur,  labspec,  phurine,  glucoseu,  hgbur,  bilirubinur,  ketonesur,  proteinur,  urobilinogen,  nitrite,  leukocytesur    STUDIES: No results found. Next mammogram will be due August 2015  ASSESSMENT: 63 y.o. BRCA-2 positive Amber Santos woman status post left lumpectomy with no axillary lymph node sampling 06/17/2012 for a pT1c pNX, clinical stage IA invasive ductal carcinoma, grade 2, 100% estrogen and 100% progesterone receptor positive, HER-2 negative, with an MIB-1 of 16%.  (1) left sentinel lymph node biopsy 07/25/2012 showed 0 of 3 lymph nodes positive  (2) Oncotype DX shows a recurrence score of 21, predicting a distant recurrence risk within 10 years of 14% if the patient's only systemic treatment is tamoxifen for 5 years  (3)  adjuvant radiation therapy completed 10/03/2012  (4) tamoxifen started January 2014  (5) s/p TAH-BSO 05/01/2013  PLAN: Jeree is doing fine as far as her breast cancer is concerned. She is having nighttime hot flashes which can be controlled with gabapentin. Today we talked about the possible benefits as well as the possible toxicities side effects and complications of this medication. She will take 300 mg at bedtime. If that does not work for her she will let me know.  She is having some vaginal wetness, and doubtless due to the tamoxifen. She uses Estrace irregularly. I encouraged her to use it more regularly and I also gave her information on our or pelvic health program".  I think her palpitations are probably benign but nevertheless deserve evaluation. I am referring her to cardiology for this. In addition, her lipid panel is generally favorable. She could work on her LDL a little bit with diet and she can discuss that with the dietitian from cardiology as well.  I think she is probably about due for repeat colonoscopy and I have sent Dr. Olevia Perches a note to consider that.  Otherwise Amber Santos will return to see me in 6 months. She knows to call for any problems that may develop before next visit here.     MAGRINAT,GUSTAV C    12/04/2013

## 2013-12-05 ENCOUNTER — Other Ambulatory Visit: Payer: Self-pay | Admitting: *Deleted

## 2013-12-06 ENCOUNTER — Telehealth: Payer: Self-pay | Admitting: *Deleted

## 2013-12-06 NOTE — Telephone Encounter (Signed)
Hello Gus, I have reviewed Amber Santos's record. Last colonoscopy was in December 2005 and she gave history of colon cancer in a maternal grandmother and maternal uncle. She will be due for recall colon in 10 years ( 09/2014). since they were only indirect relatives. But with her additional medical hx of cancer I agree that we can move ahead with colonoscopy at her convenience. If OK with you, we will notify her. Thanx for letting me know.  Previous Messages   Spoke with patient and she is having a cardiology appointment due to palpitations. She is instructed to call us when this workup is completed to schedule colonoscopy.

## 2013-12-21 ENCOUNTER — Other Ambulatory Visit: Payer: Self-pay | Admitting: *Deleted

## 2013-12-21 DIAGNOSIS — Z853 Personal history of malignant neoplasm of breast: Secondary | ICD-10-CM | POA: Insufficient documentation

## 2014-01-02 ENCOUNTER — Ambulatory Visit (INDEPENDENT_AMBULATORY_CARE_PROVIDER_SITE_OTHER): Payer: BC Managed Care – PPO | Admitting: Cardiology

## 2014-01-02 ENCOUNTER — Encounter: Payer: Self-pay | Admitting: Cardiology

## 2014-01-02 VITALS — BP 122/82 | HR 64 | Ht 67.5 in | Wt 202.4 lb

## 2014-01-02 DIAGNOSIS — R002 Palpitations: Secondary | ICD-10-CM | POA: Insufficient documentation

## 2014-01-02 NOTE — Progress Notes (Signed)
Amber Santos Date of Birth: 1950/12/10 Medical Record #[id number]  History of Present Illness: Amber Santos is seen at the request of Dr. Jana Hakim for evaluation of palpitations. She is a pleasant 63 yo WF with history of Breast CA s/p lumpectomy and RT. She has otherwise been in excellent health. In February she experienced symptoms of palpitations. She describes a feeling of her heart flip-flopping and beating fast. She typically was aware of this when at rest. Lasted less than 5-10 minutes. No dizziness, syncope, dyspnea, or chest pain. These symptoms lasted about 2 weeks then resolved. No recurrence since then. Only drinks one cup of coffee daily. No history of DM, HTN, hyperlipidemia. Stays active. She is not really concerned about the palpitations.  Current Outpatient Prescriptions on File Prior to Visit  Medication Sig Dispense Refill  . tamoxifen (NOLVADEX) 20 MG tablet Take 1 tablet (20 mg total) by mouth daily.  90 tablet  12   No current facility-administered medications on file prior to visit.    Allergies  Allergen Reactions  . Codeine Other (See Comments)    headaches    Past Medical History  Diagnosis Date  . Anemia   . Breast cancer     high grade ductal ca left breast    Past Surgical History  Procedure Laterality Date  . Cesarean section  1986, 1987  . Anterior cervical decomp/discectomy fusion  01/10/2009    C4-7  . Breast lumpectomy  06/17/2012    left partial mastectomy  . Axillary lymph node biopsy  07/25/12    left  . Robotic assisted total hysterectomy with bilateral salpingo oopherectomy Bilateral 05/01/2013    Procedure: ROBOTIC ASSISTED TOTAL HYSTERECTOMY WITH BILATERAL SALPINGO OOPHORECTOMY;  Surgeon: Lyman Speller, MD;  Location: [facility];  Service: Gynecology;  Laterality: Bilateral;  . Cystoscopy N/A 05/01/2013    Procedure: CYSTOSCOPY;  Surgeon: Lyman Speller, MD;  Location: [facility];  Service: Gynecology;  Laterality: N/A;    History    Smoking status  . Former Smoker -- 0.50 packs/day for 12 years  . Quit date: 06/17/1984  Smokeless tobacco  . Never Used    History  Alcohol Use  . Yes    Comment: weekends, events    Family History  Problem Relation Age of Onset  . Breast cancer Mother 15  . Cancer Maternal Aunt     breast, pancreatic  . Colon cancer Maternal Uncle   . Breast cancer Maternal Aunt 28  . Colon cancer Maternal Grandmother   . Kidney cancer Maternal Uncle   . Breast cancer Cousin     2 maternal cousins dx in 37s    Review of Systems: As noted in HPI.  All other systems were reviewed and are negative.  Physical Exam: BP 122/82  Pulse 64  Ht 5' 7.5" (1.715 m)  Wt 202 lb 6.4 oz (91.808 kg)  BMI 31.21 kg/m2 Very pleasant WF in NAD. HEENT: [place]/AT, PERRLA, EOMI, sclera are clear. Oropharynx is clear. Neck: no adenopathy, thyromegaly, JVD, or bruits.  Lungs: clear. CV: RRR, normal S1-2, no gallop, murmur, or click. Normal PMI. Abd: soft, NT, no masses or HSM. BS + Ext: no cyanosis or edema. Pulses 2+ Neuro: alert and oriented x 3. CN II-XII intact. Nonfocal. Skin: warm and dry without rashes.  LABORATORY DATA: Lab Results  Component Value Date   WBC 3.3* 11/27/2013   HGB 12.1 11/27/2013   HCT 37.2 11/27/2013   PLT 198 11/27/2013   GLUCOSE 107  11/27/2013   CHOL 201* 11/27/2013   TRIG 65 11/27/2013   HDL 77 11/27/2013   LDLCALC 111* 11/27/2013   ALT 26 11/27/2013   AST 34 11/27/2013   NA 142 11/27/2013   K 4.1 11/27/2013   CL 104 07/08/2012   CREATININE 0.7 11/27/2013   BUN 20.8 11/27/2013   CO2 26 11/27/2013    Ecg: NSR, rate 64 bpm, otherwise normal.  Assessment / Plan: 1. Palpitations. Symptoms may be consistent with PACs or PVCs. No real cardiac risk factors. Normal cardiac exam and Ecg. Electrolytes are normal. Symptoms have resolved. I agree with the patient that these symptoms are likely benign. I don't recommend further work up at this time unless her symptoms recur significantly. She  is reassured. 2. Borderline elevated cholesterol. LDL 111. No evidence of vascular disease or other cardiac risk factors. Recommend dietary modifications based on Mediterranean diet and regular aerobic exercise. I would not recommend lipid lowering therapy at this point.

## 2014-01-02 NOTE — Patient Instructions (Signed)
Keep a healthy lifestyle with regular aerobic exercise and a healthy diet.  Call me if your palpitations worsen.

## 2014-01-24 ENCOUNTER — Other Ambulatory Visit: Payer: Self-pay | Admitting: Oncology

## 2014-01-24 DIAGNOSIS — C50419 Malignant neoplasm of upper-outer quadrant of unspecified female breast: Secondary | ICD-10-CM

## 2014-03-08 ENCOUNTER — Telehealth: Payer: Self-pay | Admitting: Obstetrics & Gynecology

## 2014-03-08 ENCOUNTER — Encounter: Payer: Self-pay | Admitting: Certified Nurse Midwife

## 2014-03-08 ENCOUNTER — Ambulatory Visit (INDEPENDENT_AMBULATORY_CARE_PROVIDER_SITE_OTHER): Payer: BC Managed Care – PPO | Admitting: Certified Nurse Midwife

## 2014-03-08 VITALS — BP 118/76 | HR 68 | Temp 99.4°F | Ht 67.75 in | Wt 204.0 lb

## 2014-03-08 DIAGNOSIS — N390 Urinary tract infection, site not specified: Secondary | ICD-10-CM

## 2014-03-08 MED ORDER — CIPROFLOXACIN HCL 500 MG PO TABS: 500.0000 mg | ORAL_TABLET | Freq: Two times a day (BID) | ORAL | Status: DC

## 2014-03-08 MED ORDER — PHENAZOPYRIDINE HCL 200 MG PO TABS: 200.0000 mg | ORAL_TABLET | Freq: Three times a day (TID) | ORAL | Status: DC | PRN

## 2014-03-08 NOTE — Telephone Encounter (Signed)
Patient said she thinks she has a UTI and is asking for Nitrofurantoin Macrocrystal (MACRODANTIN PO). Said that dr Sabra Heck usually just gives her the rx over the phone and she doesn't have to come in. Use [facility] on file.

## 2014-03-08 NOTE — Progress Notes (Signed)
63 y.o.married white female g2p2002 here with complaint of UTI, with onset  on 48 hours ago. Patient complaining of urinary frequency/urgency/ and pain with urination. Patient denies chills, or headache. Patient had a fever this am of 101?and complaining of back pain also. No vomiting, occasional nausea, but has eaten very scant amount and limited fluid intake, because she has at work all day. Patient took two Macrobid this am within 2 hours , due to history of UTI in past. Denies fever with previous UTI. She also has taken pyridium with good relief of frequency. No new personal products. Patient feels not related to sexual activity. Denies any vaginal symptoms or new personal products. . Menopausal with out vaginal dryness. Past history of breast cancer on Tamoxifen now. No other health issues.   O: Healthy female WDWN Affect: Normal, orientation x 3 Skin : warm and dry Temp. 99.4 CVAT: positive for suprapubic tenderness  Pelvic exam: External genital area: normal, no lesions Bladder,Urethra, Urethral meatus: all tender Vagina: normal vaginal discharge, normal appearance  Wet prep Cervix: Absent Uterus:absent Adnexa:  Adnexa absent no fullness or masses   A: UTI ? pylonephritis  P: Reviewed findings of UTI and importance of treatment  With Rx and increase fluid intake . [id number] see order with instructions Rx Pyridium see order DGL:[id number] micro, culture Reviewed warning signs and symptoms of UTI and if symptoms are not decreasing needs to call or fever greater than 101.Tylenol or Motrin for headache or discomfort and fever prn. Encouraged to limit soda, tea, and coffee and increase water, cranberry juice. RV 2 week if TOC needed for positive culture Patient to call tomorrow with status  RV prn

## 2014-03-08 NOTE — Telephone Encounter (Signed)
Patient walked in requesting to expedite her request for refill of medication.  See OV note.  Routing to provider for final review. Patient agreeable to disposition. Will close encounter

## 2014-03-08 NOTE — Patient Instructions (Signed)
Urinary Tract Infection °A urinary tract infection (UTI) can occur any place along the urinary tract. The tract includes the kidneys, ureters, bladder, and urethra. A type of germ called bacteria often causes a UTI. UTIs are often helped with antibiotic medicine.  °HOME CARE  °· If given, take antibiotics as told by your doctor. Finish them even if you start to feel better. °· Drink enough fluids to keep your pee (urine) clear or pale yellow. °· Avoid tea, drinks with caffeine, and bubbly (carbonated) drinks. °· Pee often. Avoid holding your pee in for a long time. °· Pee before and after having sex (intercourse). °· Wipe from front to back after you poop (bowel movement) if you are a woman. Use each tissue only once. °GET HELP RIGHT AWAY IF:  °· You have back pain. °· You have lower belly (abdominal) pain. °· You have chills. °· You feel sick to your stomach (nauseous). °· You throw up (vomit). °· Your burning or discomfort with peeing does not go away. °· You have a fever. °· Your symptoms are not better in 3 days. °MAKE SURE YOU:  °· Understand these instructions. °· Will watch your condition. °· Will get help right away if you are not doing well or get worse. °Document Released: 03/09/2008 Document Revised: 06/15/2012 Document Reviewed: 04/21/2012 °ExitCare® Patient Information ©2014 [employer]. ° °

## 2014-03-08 NOTE — Telephone Encounter (Signed)
Routing to Dr.Miller for review and advise. 

## 2014-03-09 NOTE — Progress Notes (Signed)
Reviewed personally.  M. Suzanne Myrakle Wingler, MD.  

## 2014-03-10 LAB — URINE CULTURE

## 2014-03-12 ENCOUNTER — Telehealth: Payer: Self-pay

## 2014-03-12 NOTE — Telephone Encounter (Signed)
voicemail box full. Try again

## 2014-03-16 NOTE — Telephone Encounter (Signed)
Mailbox full.try again

## 2014-03-21 NOTE — Telephone Encounter (Signed)
Left message for call back.

## 2014-03-21 NOTE — Progress Notes (Signed)
03-21-14 no voicemail set up//jj

## 2014-03-21 NOTE — Telephone Encounter (Signed)
Patient notified of results. See lab 

## 2014-03-22 MED ORDER — NITROFURANTOIN MACROCRYSTAL 100 MG PO CAPS: 100.0000 mg | ORAL_CAPSULE | Freq: Every day | ORAL | Status: DC

## 2014-03-22 NOTE — Addendum Note (Signed)
Addended by: Megan Salon on: 03/22/2014 06:39 PM   Modules accepted: Orders

## 2014-05-07 ENCOUNTER — Other Ambulatory Visit: Payer: Self-pay | Admitting: Oncology

## 2014-05-07 DIAGNOSIS — Z853 Personal history of malignant neoplasm of breast: Secondary | ICD-10-CM

## 2014-05-07 DIAGNOSIS — Z9889 Other specified postprocedural states: Secondary | ICD-10-CM

## 2014-05-28 ENCOUNTER — Other Ambulatory Visit: Payer: Self-pay | Admitting: Oncology

## 2014-05-28 ENCOUNTER — Other Ambulatory Visit: Payer: Self-pay

## 2014-05-28 DIAGNOSIS — Z853 Personal history of malignant neoplasm of breast: Secondary | ICD-10-CM

## 2014-05-28 DIAGNOSIS — Z9889 Other specified postprocedural states: Secondary | ICD-10-CM

## 2014-05-31 ENCOUNTER — Ambulatory Visit
Admission: RE | Admit: 2014-05-31 | Discharge: 2014-05-31 | Disposition: A | Discharge status: Home / Self Care | Payer: BC Managed Care – PPO | Source: Ambulatory Visit | Attending: Oncology | Admitting: Oncology

## 2014-05-31 DIAGNOSIS — Z853 Personal history of malignant neoplasm of breast: Secondary | ICD-10-CM

## 2014-05-31 DIAGNOSIS — Z9889 Other specified postprocedural states: Secondary | ICD-10-CM

## 2014-06-04 ENCOUNTER — Encounter: Payer: Self-pay | Admitting: Obstetrics & Gynecology

## 2014-06-04 ENCOUNTER — Ambulatory Visit (INDEPENDENT_AMBULATORY_CARE_PROVIDER_SITE_OTHER): Payer: BC Managed Care – PPO | Admitting: Obstetrics & Gynecology

## 2014-06-04 VITALS — BP 128/78 | HR 68 | Ht 67.5 in | Wt 205.0 lb

## 2014-06-04 DIAGNOSIS — Z1211 Encounter for screening for malignant neoplasm of colon: Secondary | ICD-10-CM

## 2014-06-04 DIAGNOSIS — Z0000 Encounter for general adult medical examination without abnormal findings: Secondary | ICD-10-CM

## 2014-06-04 DIAGNOSIS — Z01419 Encounter for gynecological examination (general) (routine) without abnormal findings: Secondary | ICD-10-CM

## 2014-06-04 LAB — POCT URINALYSIS DIPSTICK
Bilirubin, UA: NEGATIVE
Glucose, UA: NEGATIVE
Ketones, UA: NEGATIVE
LEUKOCYTES UA: NEGATIVE
NITRITE UA: NEGATIVE
PH UA: 5
Protein, UA: NEGATIVE
RBC UA: NEGATIVE
UROBILINOGEN UA: NEGATIVE

## 2014-06-04 NOTE — Progress Notes (Signed)
63 y.o. G2P2 MarriedCaucasianF here for annual exam.  Doing well.  No vaginal bleeding.  Seeing Dr. Jana Hakim in September for follow up.  H/O biopsy showing atypical ductal hyperplasia 06/01/12.  Subsequent lumpectomy 06/17/12 showed invasive ductal carcinoma and two additional areas of DCIS but margins were clear.  ER+/PR+/her-2 negative.  Pt with BRCA 2+ hx and underwent TLH/BSO.  Now on Tamoxifen.  Has retired.  Husband, with prostate cancer, has done well from surgery except for erectile dysfunction.  This doesn't bother pt at all but does her husband.  O/W he really is doing well.    Had episode of palpitations that has been evaluated by cardiology, Dr. Peter Martinique, without any significant findings.   No LMP recorded. Patient has had a hysterectomy.          Sexually active: Yes.    The current method of family planning is post menopausal status.    Exercising: Yes.    Home exercise routine includes walking 4 times a week/ work out at [employer]. Smoker:  no  Health Maintenance: Pap:  08/26/12 Neg HR HPV History of abnormal Pap:  yes MMG:  05/31/14 BI-Rads benign, 3D Colonoscopy:  Need schedule BMD:   04/2008 -0.1/-0.  Declines this year TDaP:  03/2008 Screening Labs: no, Hb today: no, Urine today: neg, ph: 5.0   reports that she quit smoking about 29 years ago. She has never used smokeless tobacco. She reports that she drinks alcohol. She reports that she does not use illicit drugs.  Past Medical History  Diagnosis Date  . Anemia   . Breast cancer     high grade ductal ca left breast    Past Surgical History  Procedure Laterality Date  . Cesarean section  1986, 1987  . Anterior cervical decomp/discectomy fusion  01/10/2009    C4-7  . Breast lumpectomy  06/17/2012    left partial mastectomy  . Axillary lymph node biopsy  07/25/12    left  . Robotic assisted total hysterectomy with bilateral salpingo oopherectomy Bilateral 05/01/2013    Procedure: ROBOTIC ASSISTED TOTAL HYSTERECTOMY  WITH BILATERAL SALPINGO OOPHORECTOMY;  Surgeon: Lyman Speller, MD;  Location: [facility];  Service: Gynecology;  Laterality: Bilateral;  . Cystoscopy N/A 05/01/2013    Procedure: CYSTOSCOPY;  Surgeon: Lyman Speller, MD;  Location: [facility];  Service: Gynecology;  Laterality: N/A;    Current Outpatient Prescriptions  Medication Sig Dispense Refill  . nitrofurantoin (MACRODANTIN) 100 MG capsule Take 1 capsule (100 mg total) by mouth daily. Take as directed.  30 capsule  0  . phenazopyridine (PYRIDIUM) 200 MG tablet Take 1 tablet (200 mg total) by mouth 3 (three) times daily as needed for pain.  15 tablet  0  . tamoxifen (NOLVADEX) 20 MG tablet TAKE 1 TABLET BY MOUTH EVERY DAY  90 tablet  1   No current facility-administered medications for this visit.    Family History  Problem Relation Age of Onset  . Breast cancer Mother 61  . Cancer Maternal Aunt     breast, pancreatic  . Colon cancer Maternal Uncle   . Breast cancer Maternal Aunt 28  . Colon cancer Maternal Grandmother   . Kidney cancer Maternal Uncle   . Breast cancer Cousin     2 maternal cousins dx in 16s    ROS:  Pertinent items are noted in HPI.  Otherwise, a comprehensive ROS was negative.  Exam:   BP 128/78  Pulse 68  Ht 5' 7.5" (1.715  m)  Wt 205 lb (92.987 kg)  BMI 31.62 kg/m2  Height: 5' 7.5" (171.5 cm)  Ht Readings from Last 3 Encounters:  06/04/14 5' 7.5" (1.715 m)  03/08/14 5' 7.75" (1.721 m)  01/02/14 5' 7.5" (1.715 m)    General appearance: alert, cooperative and appears stated age Head: Normocephalic, without obvious abnormality, atraumatic Neck: no adenopathy, supple, symmetrical, trachea midline and thyroid normal to inspection and palpation Lungs: clear to auscultation bilaterally Breasts: normal appearance, no masses or tenderness, s/p lumpectomy on left with well healed scars and radiation changes Heart: regular rate and rhythm Abdomen: soft, non-tender; bowel sounds normal; no masses,  no  organomegaly Extremities: extremities normal, atraumatic, no cyanosis or edema Skin: Skin color, texture, turgor normal. No rashes or lesions Lymph nodes: Cervical, supraclavicular, and axillary nodes normal. No abnormal inguinal nodes palpated Neurologic: Grossly normal   Pelvic: External genitalia:  no lesions              Urethra:  normal appearing urethra with no masses, tenderness or lesions              Bartholins and Skenes: normal                 Vagina: normal appearing vagina with normal color and discharge, no lesions              Cervix: absent              Pap taken: No. Bimanual Exam:  Uterus:  uterus absent              Adnexa: no mass, fullness, tenderness               Rectovaginal: Confirms               Anus:  normal sphincter tone, no lesions  A:  Well Woman with normal exam H/O invasive ductal ca, s/p lumpectomy and radiation BRCA 2 + H/O TLH/BSO Palpitations with followup by Dr. Martinique Remote hx of smoking  P:   Mammogram yearly.  Feel pt should having yearly MRI given BRCA hx.  She hasn't been told that by Dr. Jana Hakim so she wants to discuss it with him.  Upcoming appt 06/25/14. pap smear not indicated Pt advised with surgery and continued follow up with Oncology, Rad/onc, breast surgeon, she may not need to see me yearly.  Of course when she is released from all of those individuals, in the future, she could of course go back to yearly exams with me for her breast exam.  However, she may decrease her frequency of visits with me for now.  She will consider.  Recall placed for two year follow up. return annually or prn  An After Visit Summary was printed and given to the patient.

## 2014-06-13 ENCOUNTER — Encounter: Payer: Self-pay | Admitting: Internal Medicine

## 2014-06-15 ENCOUNTER — Other Ambulatory Visit: Payer: Self-pay | Admitting: *Deleted

## 2014-06-15 DIAGNOSIS — C50412 Malignant neoplasm of upper-outer quadrant of left female breast: Secondary | ICD-10-CM

## 2014-06-18 ENCOUNTER — Other Ambulatory Visit (HOSPITAL_BASED_OUTPATIENT_CLINIC_OR_DEPARTMENT_OTHER): Payer: BC Managed Care – PPO

## 2014-06-18 DIAGNOSIS — C50412 Malignant neoplasm of upper-outer quadrant of left female breast: Secondary | ICD-10-CM

## 2014-06-18 DIAGNOSIS — C50419 Malignant neoplasm of upper-outer quadrant of unspecified female breast: Secondary | ICD-10-CM

## 2014-06-18 LAB — COMPREHENSIVE METABOLIC PANEL (CC13)
ALBUMIN: 3.5 g/dL (ref 3.5–5.0)
ALK PHOS: 40 U/L (ref 40–150)
ALT: 16 U/L (ref 0–55)
AST: 25 U/L (ref 5–34)
Anion Gap: 9 mEq/L (ref 3–11)
BUN: 15.4 mg/dL (ref 7.0–26.0)
CO2: 25 mEq/L (ref 22–29)
Calcium: 9 mg/dL (ref 8.4–10.4)
Chloride: 108 mEq/L (ref 98–109)
Creatinine: 0.8 mg/dL (ref 0.6–1.1)
Glucose: 102 mg/dl (ref 70–140)
POTASSIUM: 4.1 meq/L (ref 3.5–5.1)
SODIUM: 142 meq/L (ref 136–145)
Total Bilirubin: 0.45 mg/dL (ref 0.20–1.20)
Total Protein: 6.8 g/dL (ref 6.4–8.3)

## 2014-06-18 LAB — CBC WITH DIFFERENTIAL/PLATELET
BASO%: 0.7 % (ref 0.0–2.0)
BASOS ABS: 0 10*3/uL (ref 0.0–0.1)
EOS%: 2.1 % (ref 0.0–7.0)
Eosinophils Absolute: 0.1 10*3/uL (ref 0.0–0.5)
HCT: 37 % (ref 34.8–46.6)
HGB: 11.9 g/dL (ref 11.6–15.9)
LYMPH#: 1.1 10*3/uL (ref 0.9–3.3)
LYMPH%: 29.8 % (ref 14.0–49.7)
MCH: 26.9 pg (ref 25.1–34.0)
MCHC: 32.1 g/dL (ref 31.5–36.0)
MCV: 83.6 fL (ref 79.5–101.0)
MONO#: 0.5 10*3/uL (ref 0.1–0.9)
MONO%: 13.4 % (ref 0.0–14.0)
NEUT#: 2 10*3/uL (ref 1.5–6.5)
NEUT%: 54 % (ref 38.4–76.8)
Platelets: 196 10*3/uL (ref 145–400)
RBC: 4.42 10*6/uL (ref 3.70–5.45)
RDW: 12.9 % (ref 11.2–14.5)
WBC: 3.6 10*3/uL — ABNORMAL LOW (ref 3.9–10.3)

## 2014-06-25 ENCOUNTER — Other Ambulatory Visit: Payer: Self-pay | Admitting: Oncology

## 2014-06-25 ENCOUNTER — Telehealth: Payer: Self-pay | Admitting: Oncology

## 2014-06-25 ENCOUNTER — Ambulatory Visit (HOSPITAL_BASED_OUTPATIENT_CLINIC_OR_DEPARTMENT_OTHER): Payer: BC Managed Care – PPO | Admitting: Oncology

## 2014-06-25 VITALS — BP 134/66 | HR 72 | Temp 98.8°F | Resp 18 | Ht 67.5 in | Wt 205.9 lb

## 2014-06-25 DIAGNOSIS — Z170 Estrogen receptor positive status [ER+]: Secondary | ICD-10-CM

## 2014-06-25 DIAGNOSIS — Z1501 Genetic susceptibility to malignant neoplasm of breast: Secondary | ICD-10-CM

## 2014-06-25 DIAGNOSIS — C50419 Malignant neoplasm of upper-outer quadrant of unspecified female breast: Secondary | ICD-10-CM

## 2014-06-25 DIAGNOSIS — C50919 Malignant neoplasm of unspecified site of unspecified female breast: Secondary | ICD-10-CM

## 2014-06-25 NOTE — Progress Notes (Signed)
ID: Amber Santos   DOB: [birth year]  MR#: [id number]  AXE#:[id number]  Amber: Amber Santos/ Amber A. Ouida Sills FNP GYN:  SU: Amber Skates MD OTHER MD: Amber Santos, Amber Santos, Amber Santos   HISTORY OF BREAST CANCER: Celise had routine screening mammography at [facility] 05/23/2022 showing new calcifications in the left breast. Diagnostic left mammography 05/27/2012 confirmed a cluster of faint calcifications in the upper outer quadrant. Biopsy was performed 06/01/2012, and showed atypical ductal hyperplasia ([id number]). Accordingly the patient underwent left lumpectomy 06/17/2012. This showed ([id number]) invasive ductal carcinoma, grade 2, measuring 1.4 cm, with associated high-grade ductal carcinoma in situ. There were 2 additional areas of ductal carcinoma in situ, but margins were clear. The invasive tumor was 100% estrogen receptor and 100% progesterone receptor positive, with an MIB-1 of 20%. It was HER-2 negative.   On 06/26/2012 the patient underwent bilateral breast MRI. This found a seroma in the upper outer quadrant of the left breast measuring 6 cm. There was no other area of suspicious enhancement in the left breast, no findings of concern in the right breast, and no axillary or internal mammary adenopathy. The patient's subsequent history is as detailed below  INTERVAL HISTORY: Amber Santos returns today for followup of her breast cancer. She just returned from a trip to the beach which she greatly enjoyed. She is tolerating tamoxifen with some hot flashes, no significant vaginal wetness. She occasionally uses the Vagifem suppositories. Ask what her worse problem is, it is indeed her mother, who continues to decline and lives 3 hours away from [employer] [facility].  REVIEW OF SYSTEMS: Amber Santos is enjoying her retirement. She volunteers here and greatly enjoys it. She is going to the wife for exercise on a regular basis. A detailed review of systems today was entirely negative except  as just noted.  PAST MEDICAL HISTORY: Past Medical History  Diagnosis Date  . Anemia   . Breast cancer     high grade ductal ca left breast    PAST SURGICAL HISTORY: Past Surgical History  Procedure Laterality Date  . Cesarean section  1986, 1987  . Anterior cervical decomp/discectomy fusion  01/10/2009    C4-7  . Breast lumpectomy  06/17/2012    left partial mastectomy  . Axillary lymph node biopsy  07/25/12    left  . Robotic assisted total hysterectomy with bilateral salpingo oopherectomy Bilateral 05/01/2013    Procedure: ROBOTIC ASSISTED TOTAL HYSTERECTOMY WITH BILATERAL SALPINGO OOPHORECTOMY;  Surgeon: Amber Speller, MD;  Location: [facility];  Service: Gynecology;  Laterality: Bilateral;  . Cystoscopy N/A 05/01/2013    Procedure: CYSTOSCOPY;  Surgeon: Amber Speller, MD;  Location: [facility];  Service: Gynecology;  Laterality: N/A;    FAMILY HISTORY Family History  Problem Relation Age of Onset  . Breast cancer Mother 64  . Cancer Maternal Aunt     breast, pancreatic  . Colon cancer Maternal Uncle   . Breast cancer Maternal Aunt 28  . Colon cancer Maternal Grandmother   . Kidney cancer Maternal Uncle   . Breast cancer Cousin     2 maternal cousins dx in 66s   the patient's father died at the age of 2 from an accident. The patient's mother is living, currently 1 years old. She has a history of breast cancer, postmenopausal. The patient has one brother, no sisters. In addition the patient has a maternal aunt who was diagnosed with breast cancer at age 47 and a cousin who was diagnosed with breast  cancer at age 49 (also found to be BRCA positive, followed [facility]). She says 2 maternal uncles had a history of colon cancer.  GYNECOLOGIC HISTORY: Menarche age 49, first live birth age 28, which of course increases the risk of breast cancer. She is GX P2. 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 She is very active in [employer]. Her husband Amber Santos is V.P. at [employer]. He had surgery for prostate cancer in 2014 Son Amber Santos lives in [place] and son Amber Santos lives in [place]. Both work for [employer]: In place  HEALTH MAINTENANCE: History  Substance Use Topics  . Smoking status: Former Smoker -- 0.50 packs/day for 12 years    Quit date: 06/17/1984  . Smokeless tobacco: Never Used  . Alcohol Use: Yes     Comment: weekends, events     Colonoscopy: 2005?  PAP: 2012  Bone density: 04/12/2008/ [facility]  Lipid panel:  Allergies  Allergen Reactions  . Codeine Other (See Comments)    headaches    Current Outpatient Prescriptions  Medication Sig Dispense Refill  . nitrofurantoin (MACRODANTIN) 100 MG capsule Take 1 capsule (100 mg total) by mouth daily. Take as directed.  30 capsule  0  . phenazopyridine (PYRIDIUM) 200 MG tablet Take 1 tablet (200 mg total) by mouth 3 (three) times daily as needed for pain.  15 tablet  0  . tamoxifen (NOLVADEX) 20 MG tablet TAKE 1 TABLET BY MOUTH EVERY DAY  90 tablet  1   No current facility-administered medications for this visit.    OBJECTIVE: Middle-aged white woman in no acute distress Filed Vitals:   06/25/14 0921  BP: 134/66  Pulse: 72  Temp: 98.8 F (37.1 C)  Resp: 18     Body mass index is 31.75 kg/(m^2).    ECOG FS: 0  Sclerae unicteric, EOMs intact Oropharynx clear and moist No cervical or supraclavicular adenopathy Lungs no rales or rhonchi Heart regular rate and rhythm Abd soft, nontender, positive bowel sounds MSK no focal spinal tenderness, no upper extremity lymphedema Neuro: nonfocal, well oriented, appropriate affect Breasts: The right breast is unremarkable. The left breast is status post lumpectomy and radiation. There is no evidence of local recurrence. The left axilla is benign.    LAB RESULTS: Lab Results  Component Value Date    WBC 3.6* 06/18/2014   NEUTROABS 2.0 06/18/2014   HGB 11.9 06/18/2014   HCT 37.0 06/18/2014   MCV 83.6 06/18/2014   PLT 196 06/18/2014      Chemistry      Component Value Date/Time   NA 142 06/18/2014 0811      Component Value Date/Time   CALCIUM 9.0 06/18/2014 0811       No results found for this basename: LABCA2    No components found with this basename: LABCA125    No results found for this basename: INR,  in the last 168 hours  Urinalysis No results found for this basename: colorurine,  appearanceur,  labspec,  phurine,  glucoseu,  hgbur,  bilirubinur,  ketonesur,  proteinur,  urobilinogen,  nitrite,  leukocytesur    STUDIES: CLINICAL DATA: History of left breast cancer, status post  lumpectomy in 2013.  EXAM:  DIGITAL DIAGNOSTIC BILATERAL MAMMOGRAM WITH 3D TOMOSYNTHESIS AND CAD  COMPARISON: With priors  ACR Breast Density Category b: There are scattered areas of  fibroglandular density.  FINDINGS:  There stable lumpectomy changes in the deep upper outer left breast.  No suspicious mass, nonsurgical distortion, or suspicious  microcalcification is identified in either breast to suggest  malignancy.  Mammographic images were processed with CAD.  IMPRESSION:  No evidence of malignancy in either breast. Lumpectomy changes in  the left breast.  RECOMMENDATION:  Diagnostic mammogram is suggested in 1 year. (Code:DM-B-01Y)  I have discussed the findings and recommendations with the patient.  Results were also provided in writing at the conclusion of the  visit. If applicable, a reminder letter will be sent to the patient  regarding the next appointment.  BI-RADS CATEGORY 2: Benign.  Electronically Signed  By: Amber Santos M.D.  On: 05/31/2014 07:57    ASSESSMENT: 63 y.o. BRCA-2 positive [place] woman status post left lumpectomy with no axillary lymph node sampling 06/17/2012 for a pT1c pNX, clinical stage IA invasive ductal carcinoma, grade 2, 100% estrogen and  100% progesterone receptor positive, HER-2 negative, with an MIB-1 of 16%.  (1) left sentinel lymph node biopsy 07/25/2012 showed 0 of 3 lymph nodes positive  (2) Oncotype DX shows a recurrence score of 21, predicting a distant recurrence risk within 10 years of 14% if the patient's only systemic treatment is tamoxifen for 5 years  (3) adjuvant radiation therapy completed 10/03/2012  (4) tamoxifen started January 2014; the plan is to continue to January 2024  (5) BRCA-2 positive  (a) s/p TAH-BSO 05/01/2013  (b) to have yearly breast MRI in addition to mammography  PLAN: Luciel is now 2 years out from her definitive surgery with no evidence of disease recurrence. She will be on tamoxifen for a total of 10 years. We did again today discuss the possibility of switching to an aromatase inhibitor, which would cut the treatment time to 5 years, but Maley does not like the idea of worsening problems with vaginal dryness, weakened bones, etc. She is very comfortable continuing tamoxifen for 10 years.  We again discussed the possibility of bilateral mastectomies. She prefers to keep her breasts and this is fine so long as we obtain yearly MRIs. This is being scheduled. If she ever chooses to have bilateral prophylactic mastectomies we will refer her to plastics.  She has established herself with no plans. I will make sure they get a copy of this dictation. I am not checking her cholesterol level, etc. at this point, leaving that to her primary care physician's care.  Dennisha has a good understanding of the overall plan. She agrees with it. She knows the goal of treatment in her case is cure. She will return to see me in one year. She will call for any problems that may develop before then.       Amber Santos C    06/25/2014

## 2014-06-25 NOTE — Telephone Encounter (Signed)
per pof to sch pt appt-pt did not want MRI until Jan 2016-sent [facility] email & advpt wanted it cjgd to Jan adv pt once reply I would send  call her w/appt

## 2014-07-04 ENCOUNTER — Encounter: Payer: Self-pay | Admitting: Internal Medicine

## 2014-07-09 ENCOUNTER — Other Ambulatory Visit: Payer: Self-pay | Admitting: Dermatology

## 2014-08-06 ENCOUNTER — Encounter: Payer: Self-pay | Admitting: Obstetrics & Gynecology

## 2014-08-06 IMAGING — MG MM BREAST WIRE LOCALIZATION*L*
5 series · 5 of 5 positions shown · non-contrast
Comparison: Previous exams.

CLINICAL DATA: Atypical ductal hyperplasia diagnosed following
biopsy of left breast calcifications in May 2012.

NEEDLE LOCALIZATION WITH MAMMOGRAPHIC GUIDANCE AND SPECIMEN
RADIOGRAPH

[L CC (1 of 3)]
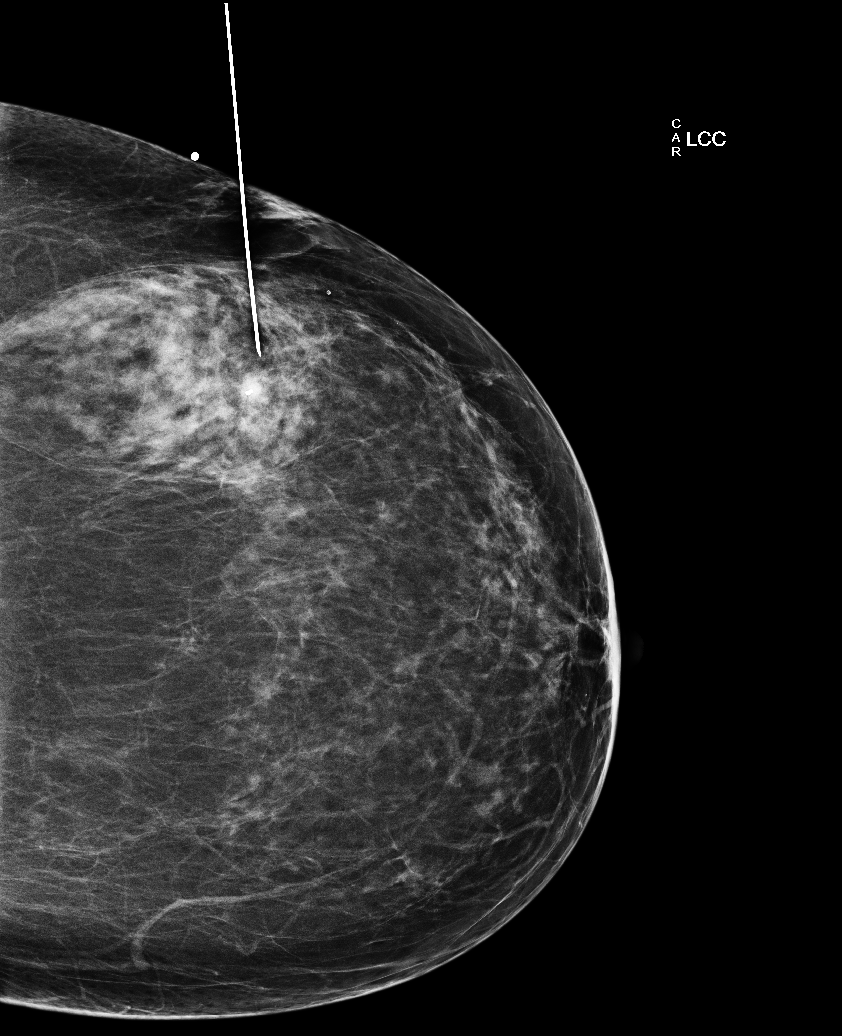

[L CC (2 of 3)]
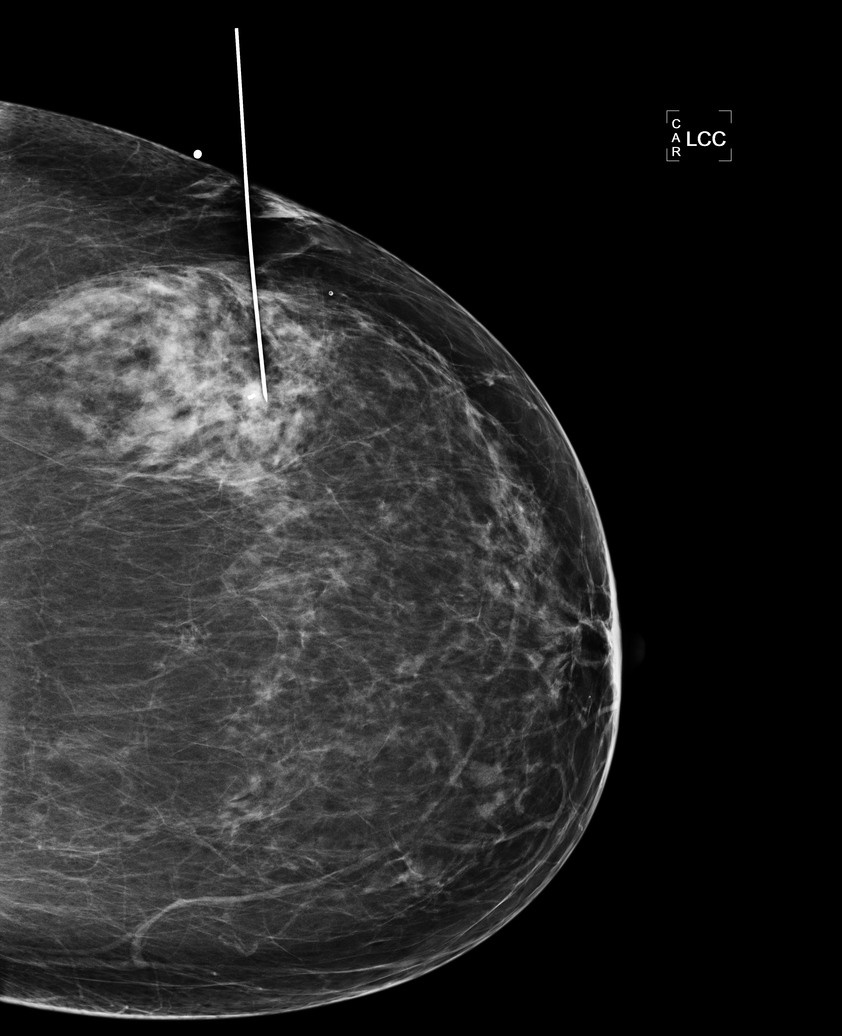

[L LM (1 of 2)]
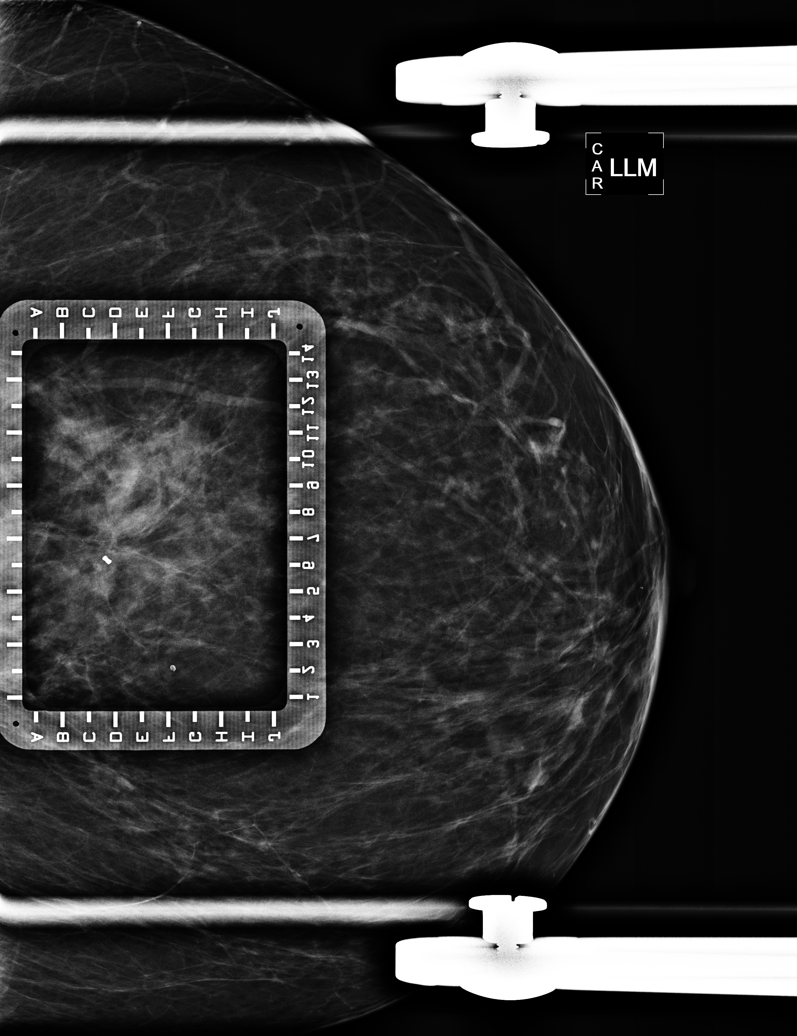

[L LM (2 of 2)]
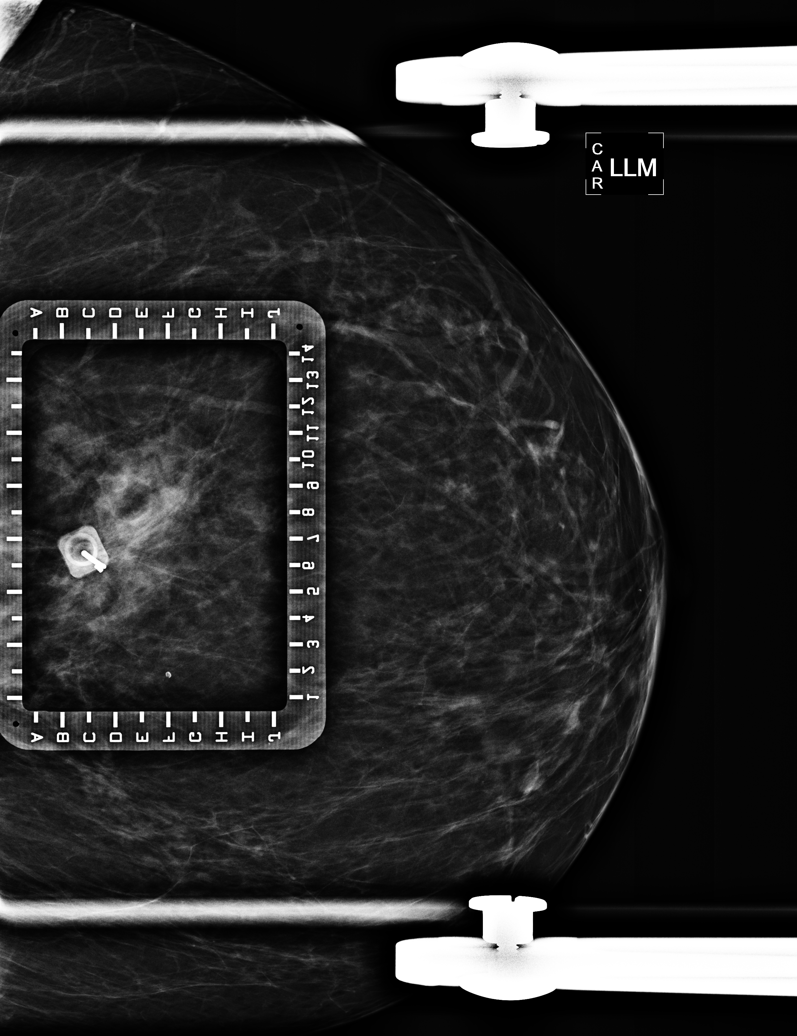

[L CC (3 of 3)]
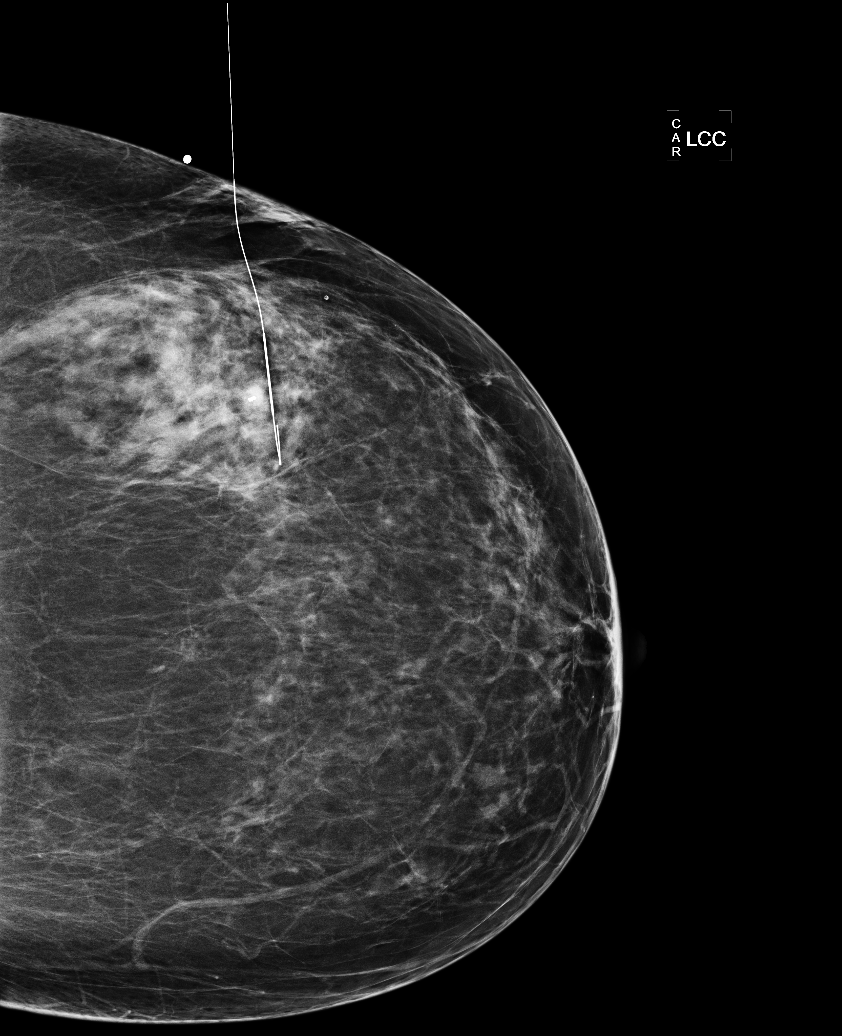

[5 of 5 positions shown; findings below may reference images not displayed]

Patient presents for needle localization prior to excisional
biopsy.  I met with the patient and we discussed the procedure of
needle localization including benefits and alternatives. We
discussed the high likelihood of a successful procedure. We
discussed the risks of the procedure, including infection,
bleeding, tissue injury, and further surgery. Informed, written
consent was given.

Using mammographic guidance, sterile technique, 2% lidocaine and a
9 cm modified Kopans needle, eight biopsy clip at the site of prior
stereotactic biopsy was localized using a lateral to medial
approach.  The films are marked for Dr. Aujla.

Specimen radiograph was performed at [facility], and confirms the
biopsy clip, a few faint calcifications, and an intact wire present
in the tissue sample.  The specimen is marked for pathology.
IMPRESSION: Needle localization of the left breast breast.  No apparent
complications.

## 2014-08-19 ENCOUNTER — Other Ambulatory Visit: Payer: Self-pay | Admitting: Oncology

## 2014-08-24 ENCOUNTER — Ambulatory Visit (AMBULATORY_SURGERY_CENTER): Payer: Self-pay | Admitting: *Deleted

## 2014-08-24 VITALS — Ht 68.0 in | Wt 196.0 lb

## 2014-08-24 DIAGNOSIS — Z1211 Encounter for screening for malignant neoplasm of colon: Secondary | ICD-10-CM

## 2014-08-24 MED ORDER — MOVIPREP 100 G PO SOLR: ORAL | Status: DC

## 2014-08-24 NOTE — Progress Notes (Signed)
Patient denies any allergies to eggs or soy. Patient denies any problems with anesthesia/sedation. Patient denies any oxygen use at home and does not take any diet/weight loss medications. EMMI education assisgned to patient on colonoscopy, this was explained and instructions given to patient. 

## 2014-09-03 ENCOUNTER — Encounter: Payer: Self-pay | Admitting: Internal Medicine

## 2014-09-14 ENCOUNTER — Encounter: Payer: Self-pay | Admitting: Internal Medicine

## 2014-09-14 ENCOUNTER — Ambulatory Visit (AMBULATORY_SURGERY_CENTER): Payer: BC Managed Care – PPO | Admitting: Internal Medicine

## 2014-09-14 VITALS — BP 133/75 | HR 58 | Temp 97.5°F | Resp 16 | Ht 67.0 in | Wt 205.0 lb

## 2014-09-14 DIAGNOSIS — Z1211 Encounter for screening for malignant neoplasm of colon: Secondary | ICD-10-CM

## 2014-09-14 MED ORDER — SODIUM CHLORIDE 0.9 % IV SOLN
500.0000 mL | INTRAVENOUS | Status: DC
Start: 2014-09-14 — End: 2014-09-14

## 2014-09-14 NOTE — Progress Notes (Signed)
Stable to RR 

## 2014-09-14 NOTE — Patient Instructions (Signed)

## 2014-09-14 NOTE — Op Note (Signed)
[facility] [facility]  [address] [place], [id number]   COLONOSCOPY PROCEDURE REPORT  PATIENT: Amber, Santos  MR#: [id number] BIRTHDATE: [birth year] , 63  yrs. old GENDER: female ENDOSCOPIST: Lafayette Dragon, MD REFERRED PP:JKDTOI Magrinat, M.D. , Dr Edwinna Areola  PROCEDURE DATE:  09/14/2014 PROCEDURE:   Colonoscopy, screening First Screening Colonoscopy - Avg.  risk and is 50 yrs.  old or older - No.  Prior Negative Screening - Now for repeat screening. 10 or more years since last screening  History of Adenoma - Now for follow-up colonoscopy & has been > or = to 3 yrs.  N/A  Polyps Removed Today? No.  Polyps Removed Today? No.  Recommend repeat exam, <10 yrs? Polyps Removed Today? No.  Recommend repeat exam, <10 yrs? No. ASA CLASS:   Class II INDICATIONS:average risk for colon cancer and prior colonoscopy in December 2005 was a normal exam.  Patient has a distant history of colon cancer in 2 indirect relatives, maternal uncle and maternal grandmother. MEDICATIONS: Monitored anesthesia care, Propofol 250 mg IV, and Atropine .4 mg IV  DESCRIPTION OF PROCEDURE:   After the risks benefits and alternatives of the procedure were thoroughly explained, informed consent was obtained.  The digital rectal exam revealed no abnormalities of the rectum.   The LB [id number]  endoscope was introduced through the anus and advanced to the cecum, which was identified by both the appendix and ileocecal valve. No adverse events experienced.   The quality of the prep was excellent, using MoviPrep  The instrument was then slowly withdrawn as the colon was fully examined.      COLON FINDINGS: A normal appearing cecum, ileocecal valve, and appendiceal orifice were identified.  The ascending, transverse, descending, sigmoid colon, and rectum appeared unremarkable. Retroflexed views revealed no abnormalities. The time to cecum=10 minutes 04 seconds.  Withdrawal time=6 minutes 05  seconds.  The scope was withdrawn and the procedure completed. COMPLICATIONS: There were no immediate complications.  ENDOSCOPIC IMPRESSION: Normal colonoscopy  RECOMMENDATIONS: High fiber diet Recall colonoscopy in 10 years  eSigned:  Lafayette Dragon, MD 09/14/2014 8:32 AM   cc:

## 2014-09-17 ENCOUNTER — Telehealth: Payer: Self-pay

## 2014-09-17 NOTE — Telephone Encounter (Signed)
Left message on answering machine. 

## 2014-12-21 ENCOUNTER — Telehealth: Payer: Self-pay | Admitting: Oncology

## 2014-12-21 NOTE — Telephone Encounter (Signed)
Lvm advising appt chg from 9/26 to 9/22 @ 9am with labs on 9/19. Also mailed appts.

## 2015-03-16 ENCOUNTER — Other Ambulatory Visit: Payer: Self-pay | Admitting: Oncology

## 2015-05-15 ENCOUNTER — Other Ambulatory Visit: Payer: Self-pay

## 2015-05-15 ENCOUNTER — Other Ambulatory Visit: Payer: Self-pay | Admitting: Oncology

## 2015-05-15 DIAGNOSIS — Z853 Personal history of malignant neoplasm of breast: Secondary | ICD-10-CM

## 2015-06-04 ENCOUNTER — Ambulatory Visit
Admission: RE | Admit: 2015-06-04 | Discharge: 2015-06-04 | Disposition: A | Discharge status: Home / Self Care | Payer: BC Managed Care – PPO | Source: Ambulatory Visit | Attending: Oncology | Admitting: Oncology

## 2015-06-04 DIAGNOSIS — Z853 Personal history of malignant neoplasm of breast: Secondary | ICD-10-CM

## 2015-06-24 ENCOUNTER — Other Ambulatory Visit (HOSPITAL_BASED_OUTPATIENT_CLINIC_OR_DEPARTMENT_OTHER): Payer: BC Managed Care – PPO

## 2015-06-24 DIAGNOSIS — C50419 Malignant neoplasm of upper-outer quadrant of unspecified female breast: Secondary | ICD-10-CM

## 2015-06-24 DIAGNOSIS — C50919 Malignant neoplasm of unspecified site of unspecified female breast: Secondary | ICD-10-CM

## 2015-06-24 LAB — COMPREHENSIVE METABOLIC PANEL (CC13)
ALBUMIN: 3.5 g/dL (ref 3.5–5.0)
ALK PHOS: 42 U/L (ref 40–150)
ALT: 30 U/L (ref 0–55)
AST: 31 U/L (ref 5–34)
Anion Gap: 7 mEq/L (ref 3–11)
BUN: 19.5 mg/dL (ref 7.0–26.0)
CO2: 25 meq/L (ref 22–29)
Calcium: 8.8 mg/dL (ref 8.4–10.4)
Chloride: 109 mEq/L (ref 98–109)
Creatinine: 0.7 mg/dL (ref 0.6–1.1)
EGFR: 88 mL/min/{1.73_m2} — AB (ref >90)
GLUCOSE: 102 mg/dL (ref 70–140)
POTASSIUM: 4.1 meq/L (ref 3.5–5.1)
SODIUM: 141 meq/L (ref 136–145)
TOTAL PROTEIN: 6.5 g/dL (ref 6.4–8.3)
Total Bilirubin: 0.61 mg/dL (ref 0.20–1.20)

## 2015-06-24 LAB — CBC WITH DIFFERENTIAL/PLATELET
BASO%: 0.3 % (ref 0.0–2.0)
BASOS ABS: 0 10*3/uL (ref 0.0–0.1)
EOS ABS: 0.1 10*3/uL (ref 0.0–0.5)
EOS%: 2.8 % (ref 0.0–7.0)
HEMATOCRIT: 35.8 % (ref 34.8–46.6)
HGB: 11.8 g/dL (ref 11.6–15.9)
LYMPH#: 1.1 10*3/uL (ref 0.9–3.3)
LYMPH%: 31.4 % (ref 14.0–49.7)
MCH: 27.5 pg (ref 25.1–34.0)
MCHC: 33 g/dL (ref 31.5–36.0)
MCV: 83.4 fL (ref 79.5–101.0)
MONO#: 0.5 10*3/uL (ref 0.1–0.9)
MONO%: 14.3 % — ABNORMAL HIGH (ref 0.0–14.0)
NEUT#: 1.8 10*3/uL (ref 1.5–6.5)
NEUT%: 51.2 % (ref 38.4–76.8)
PLATELETS: 174 10*3/uL (ref 145–400)
RBC: 4.29 10*6/uL (ref 3.70–5.45)
RDW: 12.8 % (ref 11.2–14.5)
WBC: 3.6 10*3/uL — ABNORMAL LOW (ref 3.9–10.3)

## 2015-06-27 ENCOUNTER — Ambulatory Visit (HOSPITAL_BASED_OUTPATIENT_CLINIC_OR_DEPARTMENT_OTHER): Payer: BC Managed Care – PPO | Admitting: Oncology

## 2015-06-27 ENCOUNTER — Telehealth: Payer: Self-pay | Admitting: Oncology

## 2015-06-27 VITALS — BP 132/73 | HR 70 | Temp 98.6°F | Resp 18 | Ht 67.0 in | Wt 203.2 lb

## 2015-06-27 DIAGNOSIS — C50419 Malignant neoplasm of upper-outer quadrant of unspecified female breast: Secondary | ICD-10-CM

## 2015-06-27 MED ORDER — GABAPENTIN 100 MG PO CAPS: 100.0000 mg | ORAL_CAPSULE | Freq: Every day | ORAL | Status: DC

## 2015-06-27 NOTE — Progress Notes (Signed)
ID: Amber Santos   DOB: 1951-09-24  MR#: [id number]  YDX#:[id number]  PCP: Chauncey Cruel, MD/ Epimenio Foot Ouida Sills FNP GYN:  SU: Fanny Skates MD OTHER MD: Lance Morin, Delfin Edis, Peter Martinique   HISTORY OF BREAST CANCER: Amber Santos had routine screening mammography at [facility] 05/23/2022 showing new calcifications in the left breast. Diagnostic left mammography 05/27/2012 confirmed a cluster of faint calcifications in the upper outer quadrant. Biopsy was performed 06/01/2012, and showed atypical ductal hyperplasia ([id number]). Accordingly the patient underwent left lumpectomy 06/17/2012. This showed ([id number]) invasive ductal carcinoma, grade 2, measuring 1.4 cm, with associated high-grade ductal carcinoma in situ. There were 2 additional areas of ductal carcinoma in situ, but margins were clear. The invasive tumor was 100% estrogen receptor and 100% progesterone receptor positive, with an MIB-1 of 20%. It was HER-2 negative.   On 06/26/2012 the patient underwent bilateral breast MRI. This found a seroma in the upper outer quadrant of the left breast measuring 6 cm. There was no other area of suspicious enhancement in the left breast, no findings of concern in the right breast, and no axillary or internal mammary adenopathy. The patient's subsequent history is as detailed below  INTERVAL HISTORY: Amber Santos returns today for followup of her breast cancer. The interval history is generally unremarkable P a she continues on tamoxifen, with excellent tolerance. She does have hot flashes and night sweats. She had tried gabapentin but it made her too woozy. We never did try the lower dose but she is agreeable to giving that a try. She doesn't have vaginal wetness problems.  REVIEW OF SYSTEMS: Amber Santos mother has Alzheimer's. She is not heritage [place] and 10. This is a source of stress for Amber Santos. However she, Amber Santos, has participated in our 5K and also 5K for Alzheimer's. Aside from  the hot flashes disc mentioned a detailed review of systems today was entirely negative.  PAST MEDICAL HISTORY: Past Medical History  Diagnosis Date  . Anemia   . Breast cancer 2013    high grade ductal ca left breast    PAST SURGICAL HISTORY: Past Surgical History  Procedure Laterality Date  . Cesarean section  1986, 1987  . Anterior cervical decomp/discectomy fusion  01/10/2009    C4-7  . Breast lumpectomy  06/17/2012    left partial mastectomy  . Axillary lymph node biopsy  07/25/12    left  . Robotic assisted total hysterectomy with bilateral salpingo oopherectomy Bilateral 05/01/2013    Procedure: ROBOTIC ASSISTED TOTAL HYSTERECTOMY WITH BILATERAL SALPINGO OOPHORECTOMY;  Surgeon: Lyman Speller, MD;  Location: [facility];  Service: Gynecology;  Laterality: Bilateral;  . Cystoscopy N/A 05/01/2013    Procedure: CYSTOSCOPY;  Surgeon: Lyman Speller, MD;  Location: [facility];  Service: Gynecology;  Laterality: N/A;    FAMILY HISTORY Family History  Problem Relation Age of Onset  . Breast cancer Mother 70  . Cancer Maternal Aunt     breast, pancreatic  . Colon cancer Maternal Uncle   . Breast cancer Maternal Aunt 28  . Colon cancer Maternal Grandmother   . Kidney cancer Maternal Uncle   . Breast cancer Cousin     2 maternal cousins dx in 76s   the patient's father died at the age of 64 from an accident. The patient's mother is living, currently 63 years old. She has a history of breast cancer, postmenopausal. The patient has one brother, no sisters. In addition the patient has a maternal aunt who was diagnosed with breast  cancer at age 76 and a cousin who was diagnosed with breast cancer at age 12 (also found to be BRCA positive, followed at [facility]). She says 2 maternal uncles had a history of colon cancer.  GYNECOLOGIC HISTORY: Menarche age 48, first live birth age 38, which of course increases the risk of breast cancer. She is GX P2. 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 She is very active in [employer]. Her husband Amber Santos is V.P. at [employer]. He had surgery for prostate cancer in 2014 Son Amber Santos lives in [place] and son Amber Santos lives in [place]. Both work for [employer]: In place  HEALTH MAINTENANCE: Social History  Substance Use Topics  . Smoking status: Former Smoker -- 0.50 packs/day for 12 years    Quit date: 06/17/1984  . Smokeless tobacco: Never Used  . Alcohol Use: 1.8 oz/week    3 Shots of liquor per week     Comment: weekends, events     Colonoscopy: 2005?  PAP: 2012  Bone density: [facility]/12/[facility]/ [facility]l  Lipid panel:  Allergies  Allergen Reactions  . Codeine Other (See Comments)    headaches    Current Outpatient Prescriptions  Medication Sig Dispense Refill  . tamoxifen (NOLVADEX) 20 MG tablet TAKE 1 TABLET BY MOUTH EVERY DAY 90 tablet 1   No current facility-administered medications for this visit.    OBJECTIVE: Middle-aged white woman who appears well Filed Vitals:   06/27/15 0912  BP: 132/73  Pulse: 70  Temp: 98.6 F (37 C)  Resp: 18     Body mass index is 31.82 kg/(m^2).    ECOG FS: 0  Sclerae unicteric, pupils round and equal Oropharynx clear and moist-- no thrush or other lesions No cervical or supraclavicular adenopathy Lungs no rales or rhonchi Heart regular rate and rhythm Abd soft, nontender, positive bowel sounds MSK no focal spinal tenderness, no upper extremity lymphedema Neuro: nonfocal, well oriented, appropriate affect Breasts: The right breast is benign. The left breast is status post lumpectomy and radiation per there is no evidence of local recurrence. Left axilla is benign.   LAB RESULTS: Lab Results  Component Value Date   WBC 3.6* 06/24/2015   NEUTROABS 1.8 06/24/2015   HGB 11.8 06/24/2015   HCT 35.8 06/24/2015   MCV 83.4 06/24/2015    PLT 174 06/24/2015      Chemistry      Component Value Date/Time   NA 141 06/24/2015 0809      Component Value Date/Time   CALCIUM 8.8 06/24/2015 0809       No results found for: LABCA2  No components found for: NOMVE720  No results for input(s): INR in the last 168 hours.  Urinalysis No results found for: COLORURINE  STUDIES: CLINICAL DATA: History of malignant lumpectomy of the left breast in 2013. Followup evaluation.  EXAM: DIGITAL DIAGNOSTIC BILATERAL MAMMOGRAM WITH 3D TOMOSYNTHESIS AND CAD  COMPARISON: Previous examinations the most recent which is dated 05/31/2014.  ACR Breast Density Category b: There are scattered areas of fibroglandular density.  FINDINGS: There are stable scarring changes located within the upper outer quadrant of the left breast related to the patient's lumpectomy. The breast parenchymal pattern is stable. There is no specific evidence for recurrent tumor or developing malignancy within either breast.  Mammographic images were processed with CAD.  IMPRESSION: Stable breast parenchymal pattern. No findings worrisome for  recurrent tumor or developing malignancy.  RECOMMENDATION: Bilateral diagnostic mammography in 1 year.  I have discussed the findings and recommendations with the patient. Results were also provided in writing at the conclusion of the visit. If applicable, a reminder letter will be sent to the patient regarding the next appointment.  BI-RADS CATEGORY 1: Negative.   Electronically Signed  By: Altamese Cabal M.D.  On: 06/04/2015 11:00  ASSESSMENT: 64 y.o. BRCA-2 positive Amber Santos woman status post left lumpectomy with no axillary lymph node sampling 06/17/2012 for a pT1c pNX, clinical stage IA invasive ductal carcinoma, grade 2, 100% estrogen and 100% progesterone receptor positive, HER-2 negative, with an MIB-1 of 16%.  (1) left sentinel lymph node biopsy 07/25/2012 showed 0 of 3 lymph  nodes positive  (2) Oncotype DX shows a recurrence score of 21, predicting a distant recurrence risk within 10 years of 14% if the patient's only systemic treatment is tamoxifen for 5 years  (3) adjuvant radiation therapy completed 10/03/2012  (4) tamoxifen started January 2014; the plan is to continue to January 2024  (5) BRCA-2 positive  (a) s/p TAH-BSO 05/01/2013  (b) to have yearly breast MRI in addition to mammography  PLAN: Amber Santos is 3 years out from her definitive surgery with no evidence of disease recurrence. This is very favorable.  We again discussed the fact that the standard of care and follow-up of patients like her to call for an MRI every year. The problem with this test his cost. Nonetheless my suggestion is that we go ahead and schedule it. She can find out how much out-of-pocket costs there will be. If it is excessive she will cancel it.  It is of course appropriate that she is getting tomosynthesis with her mammography. This also increases the sensitivity.  We discussed switching to anastrozole but she is very comfortable on tamoxifen and she is willing to take it for 10 years. That accordingly remains the plan.  We are adding gabapentin 100 mg at bedtime for the nighttime hot flashes. She will let me know if that does not work for her.  She will see me again a year from now. She knows to call for any problems that may develop before that visit.     Whit Bruni C    06/27/2015

## 2015-06-27 NOTE — Telephone Encounter (Signed)
Appointments made and avs pritned for patient,patient to call [facility]

## 2015-07-01 ENCOUNTER — Other Ambulatory Visit: Payer: BC Managed Care – PPO

## 2015-07-01 ENCOUNTER — Ambulatory Visit: Payer: BC Managed Care – PPO | Admitting: Oncology

## 2015-07-18 ENCOUNTER — Ambulatory Visit: Payer: BC Managed Care – PPO | Admitting: Obstetrics & Gynecology

## 2015-09-12 ENCOUNTER — Other Ambulatory Visit: Payer: Self-pay | Admitting: Oncology

## 2015-10-15 ENCOUNTER — Telehealth: Payer: Self-pay | Admitting: Obstetrics & Gynecology

## 2015-10-15 NOTE — Telephone Encounter (Signed)
Patient called and left a message on the answering machine at lunch. She said, "I'd like Dr. Sabra Heck to call in a prescription for microdantin for [facility] to [facility] [place]. I have a running prescription for this."  Last seen: AEX 06/04/14

## 2015-10-15 NOTE — Telephone Encounter (Signed)
Routing to [place] for review and advise. Patient was last prescribed Macrodantin 100 mg take one tablet by mouth daily as directed on 03/19/2014 #30 0RF. Spoke with patient. Patient states that she get 3-4 UTI's per year. Has two pills left. Today she began to experience burning with urination and frequency. Patient is requesting a refill at this time. Denies any lower back pain, fevers, or chills. Advised I will speak with Dr.Miller and return call with further recommendations. Patient is agreeable.

## 2015-10-15 NOTE — Telephone Encounter (Signed)
I think she needs an OV as we haven't seen her in over a year.

## 2015-10-16 ENCOUNTER — Ambulatory Visit (INDEPENDENT_AMBULATORY_CARE_PROVIDER_SITE_OTHER): Payer: BC Managed Care – PPO | Admitting: Obstetrics and Gynecology

## 2015-10-16 ENCOUNTER — Encounter: Payer: Self-pay | Admitting: Obstetrics and Gynecology

## 2015-10-16 VITALS — BP 122/80 | HR 64 | Resp 16 | Wt 200.0 lb

## 2015-10-16 DIAGNOSIS — R3915 Urgency of urination: Secondary | ICD-10-CM

## 2015-10-16 DIAGNOSIS — R350 Frequency of micturition: Secondary | ICD-10-CM | POA: Diagnosis not present

## 2015-10-16 DIAGNOSIS — R300 Dysuria: Secondary | ICD-10-CM

## 2015-10-16 MED ORDER — SULFAMETHOXAZOLE-TRIMETHOPRIM 800-160 MG PO TABS: 1.0000 | ORAL_TABLET | Freq: Two times a day (BID) | ORAL | Status: DC

## 2015-10-16 NOTE — Patient Instructions (Signed)

## 2015-10-16 NOTE — Addendum Note (Signed)
Addended by: Susanne Greenhouse E on: 10/16/2015 02:35 PM   Modules accepted: Orders

## 2015-10-16 NOTE — Progress Notes (Signed)
Patient ID: Amber Santos, female   DOB: 1951/05/08, 65 y.o.   MRN: [id number] GYNECOLOGY  VISIT   HPI: 65 y.o.   Married  Caucasian  female   G2P2 with No LMP recorded. Patient has had a hysterectomy.   here c/o dusuria and urinary frequency. The patient's symptoms started about 2 weeks ago. She took macrobid that she had at home x 2-3 days and pyridium. Felt better until yesterday. She took some macrobid she had at home, 2 x yesterday and 1 time today. She has urinary frequency, pain and urgency. Leaking urine yesterday, not typical for her. No fevers, no flank pain.  GYNECOLOGIC HISTORY: No LMP recorded. Patient has had a hysterectomy. Contraception:hysterectomy  Menopausal hormone therapy: none        OB History    Gravida Para Term Preterm AB TAB SAB Ectopic Multiple Living   2 2                 Patient Active Problem List   Diagnosis Date Noted  . Palpitations 01/02/2014  . H/O malignant neoplasm of breast 12/21/2013  . BRCA2 positive 08/07/2013  . Genetic susceptibility to breast cancer 08/07/2013  . Endometrial polyp 03/26/2013  . History of endometriosis 03/26/2013  . 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 Genetic susceptibility to ovarian cancer 12/26/2012  . Cancer of upper-outer quadrant of female breast ([facility]) 07/07/2012    Past Medical History  Diagnosis Date  . Anemia   . Breast cancer ([facility]) 2013    high grade ductal ca left breast    Past Surgical History  Procedure Laterality Date  . Cesarean section  1986, 1987  . Anterior cervical decomp/discectomy fusion  01/10/2009    C4-7  . Breast lumpectomy  06/17/2012    left partial mastectomy  . Axillary lymph node biopsy  07/25/12    left  . Robotic assisted total hysterectomy with bilateral salpingo oopherectomy Bilateral 05/01/2013    Procedure: ROBOTIC ASSISTED TOTAL HYSTERECTOMY WITH BILATERAL SALPINGO OOPHORECTOMY;  Surgeon: Lyman Speller, MD;  Location: [facility];  Service:  Gynecology;  Laterality: Bilateral;  . Cystoscopy N/A 05/01/2013    Procedure: CYSTOSCOPY;  Surgeon: Lyman Speller, MD;  Location: [facility];  Service: Gynecology;  Laterality: N/A;    Current Outpatient Prescriptions  Medication Sig Dispense Refill  . tamoxifen (NOLVADEX) 20 MG tablet TAKE 1 TABLET BY MOUTH EVERY DAY 90 tablet 1   No current facility-administered medications for this visit.     ALLERGIES: Codeine  Family History  Problem Relation Age of Onset  . Breast cancer Mother 66  . Cancer Maternal Aunt     breast, pancreatic  . Colon cancer Maternal Uncle   . Breast cancer Maternal Aunt 28  . Colon cancer Maternal Grandmother   . Kidney cancer Maternal Uncle   . Breast cancer Cousin     2 maternal cousins dx in 74s    Social History   Social History  . Marital Status: Married    Spouse Name: N/A  . Number of Children: 2  . Years of Education: N/A   Occupational History  . [employer]   Social History Main Topics  . Smoking status: Former Smoker -- 0.50 packs/day for 12 years    Quit date: 06/17/1984  . Smokeless tobacco: Never Used  . Alcohol Use: 1.8 oz/week    3 Shots of liquor per week     Comment: weekends, events  .  Drug Use: No  . Sexual Activity: Not on file   Other Topics Concern  . Not on file   Social History Narrative    Review of Systems  Constitutional: Negative.   HENT: Negative.   Eyes: Negative.   Respiratory: Negative.   Cardiovascular: Negative.   Gastrointestinal: Negative.   Genitourinary: Positive for dysuria, urgency and frequency.  Musculoskeletal: Negative.   Skin: Negative.   Neurological: Negative.   Endo/Heme/Allergies: Negative.   Psychiatric/Behavioral: Negative.     PHYSICAL EXAMINATION:    BP 122/80 mmHg  Pulse 64  Resp 16  Wt 200 lb (90.719 kg)    General appearance: alert, cooperative and appears stated age Abdomen: soft, non-tender; bowel sounds normal; no masses,  no  organomegaly CVA: not tender   ASSESSMENT Dysuria, frequency, urgency, cw UTI In the past she has taken antibiotics with symptoms of a UTI, she is out of antibiotics Last documented urine culture was in 6/15 and was a contaminated specimen, not cw UTI    PLAN Send urine for ua, c&s Treat with Bactrim DS, she may be partially treated with the macrobid, so even if her culture is negative, I would have her finish the antibiotics She was instructed not to take the uristat for >48 hours. She needs to call if her symptoms return with stopping the uristat   An After Visit Summary was printed and given to the patient.

## 2015-10-16 NOTE — Telephone Encounter (Signed)
Spoke with patient. Advised of message as seen below from [place]. Patient is agreeable. Requesting to be seen today. Advised Dr.Miller is out of the office today. Patient would like to see another provider to be seen today. Appointment scheduled for today 10/16/2015 at 10:30 am with Dr.Jertson. Patient is agreeable.  Cc: Dr.Miller  Routing to provider for final review. Patient agreeable to disposition. Will close encounter.

## 2015-10-17 LAB — URINALYSIS, MICROSCOPIC ONLY
Bacteria, UA: NONE SEEN [HPF]
CRYSTALS: NONE SEEN [HPF]
Casts: NONE SEEN [LPF]
RBC / HPF: NONE SEEN RBC/HPF (ref <=2)
WBC UA: NONE SEEN WBC/HPF (ref <=5)
Yeast: NONE SEEN [HPF]

## 2015-10-17 LAB — URINE CULTURE: Colony Count: 8000

## 2016-04-06 ENCOUNTER — Other Ambulatory Visit: Payer: Self-pay | Admitting: Oncology

## 2016-04-06 NOTE — Telephone Encounter (Signed)
Chart reviewed.

## 2016-05-01 ENCOUNTER — Other Ambulatory Visit: Payer: Self-pay | Admitting: Oncology

## 2016-05-01 DIAGNOSIS — Z853 Personal history of malignant neoplasm of breast: Secondary | ICD-10-CM

## 2016-05-11 ENCOUNTER — Telehealth: Payer: Self-pay | Admitting: *Deleted

## 2016-05-11 NOTE — Telephone Encounter (Signed)
Pt called stating that she will not be able to make her appt on 9/25 and needs to reschedule.  Confirmed 9/12 appt w/ pt.

## 2016-06-05 ENCOUNTER — Ambulatory Visit
Admission: RE | Admit: 2016-06-05 | Discharge: 2016-06-05 | Disposition: A | Discharge status: Home / Self Care | Payer: BC Managed Care – PPO | Source: Ambulatory Visit | Attending: Oncology | Admitting: Oncology

## 2016-06-05 DIAGNOSIS — Z853 Personal history of malignant neoplasm of breast: Secondary | ICD-10-CM

## 2016-06-15 ENCOUNTER — Other Ambulatory Visit: Payer: Self-pay

## 2016-06-15 DIAGNOSIS — C50419 Malignant neoplasm of upper-outer quadrant of unspecified female breast: Secondary | ICD-10-CM

## 2016-06-16 ENCOUNTER — Ambulatory Visit (HOSPITAL_BASED_OUTPATIENT_CLINIC_OR_DEPARTMENT_OTHER): Payer: BC Managed Care – PPO | Admitting: Oncology

## 2016-06-16 ENCOUNTER — Ambulatory Visit (HOSPITAL_BASED_OUTPATIENT_CLINIC_OR_DEPARTMENT_OTHER): Payer: BC Managed Care – PPO

## 2016-06-16 ENCOUNTER — Other Ambulatory Visit: Payer: Self-pay

## 2016-06-16 VITALS — BP 126/78 | HR 72 | Temp 98.7°F | Resp 18 | Ht 67.0 in | Wt 198.4 lb

## 2016-06-16 DIAGNOSIS — Z170 Estrogen receptor positive status [ER+]: Secondary | ICD-10-CM

## 2016-06-16 DIAGNOSIS — Z79810 Long term (current) use of selective estrogen receptor modulators (SERMs): Secondary | ICD-10-CM | POA: Diagnosis not present

## 2016-06-16 DIAGNOSIS — C50419 Malignant neoplasm of upper-outer quadrant of unspecified female breast: Secondary | ICD-10-CM

## 2016-06-16 DIAGNOSIS — C50412 Malignant neoplasm of upper-outer quadrant of left female breast: Secondary | ICD-10-CM

## 2016-06-16 DIAGNOSIS — Z1501 Genetic susceptibility to malignant neoplasm of breast: Secondary | ICD-10-CM

## 2016-06-16 LAB — CBC WITH DIFFERENTIAL/PLATELET
BASO%: 0.7 % (ref 0.0–2.0)
BASOS ABS: 0 10*3/uL (ref 0.0–0.1)
EOS%: 2.9 % (ref 0.0–7.0)
Eosinophils Absolute: 0.1 10*3/uL (ref 0.0–0.5)
HEMATOCRIT: 38.2 % (ref 34.8–46.6)
HGB: 12.4 g/dL (ref 11.6–15.9)
LYMPH#: 1.3 10*3/uL (ref 0.9–3.3)
LYMPH%: 30.4 % (ref 14.0–49.7)
MCH: 26.9 pg (ref 25.1–34.0)
MCHC: 32.5 g/dL (ref 31.5–36.0)
MCV: 82.8 fL (ref 79.5–101.0)
MONO#: 0.5 10*3/uL (ref 0.1–0.9)
MONO%: 12.5 % (ref 0.0–14.0)
NEUT#: 2.2 10*3/uL (ref 1.5–6.5)
NEUT%: 53.5 % (ref 38.4–76.8)
Platelets: 219 10*3/uL (ref 145–400)
RBC: 4.62 10*6/uL (ref 3.70–5.45)
RDW: 13.2 % (ref 11.2–14.5)
WBC: 4.1 10*3/uL (ref 3.9–10.3)

## 2016-06-16 LAB — COMPREHENSIVE METABOLIC PANEL
ALT: 34 U/L (ref 0–55)
ANION GAP: 8 meq/L (ref 3–11)
AST: 32 U/L (ref 5–34)
Albumin: 3.5 g/dL (ref 3.5–5.0)
Alkaline Phosphatase: 55 U/L (ref 40–150)
BUN: 21.1 mg/dL (ref 7.0–26.0)
CALCIUM: 9.4 mg/dL (ref 8.4–10.4)
CHLORIDE: 108 meq/L (ref 98–109)
CO2: 26 mEq/L (ref 22–29)
Creatinine: 0.9 mg/dL (ref 0.6–1.1)
EGFR: 67 mL/min/{1.73_m2} — AB (ref >90)
Glucose: 90 mg/dl (ref 70–140)
POTASSIUM: 4.2 meq/L (ref 3.5–5.1)
Sodium: 141 mEq/L (ref 136–145)
Total Bilirubin: 0.51 mg/dL (ref 0.20–1.20)
Total Protein: 7.3 g/dL (ref 6.4–8.3)

## 2016-06-16 MED ORDER — TAMOXIFEN CITRATE 20 MG PO TABS: 20.0000 mg | ORAL_TABLET | Freq: Every day | ORAL | 1 refills | Status: DC

## 2016-06-16 MED ORDER — GABAPENTIN 100 MG PO CAPS: 100.0000 mg | ORAL_CAPSULE | Freq: Every day | ORAL | 4 refills | Status: DC

## 2016-06-16 NOTE — Progress Notes (Signed)
ID: Amber Santos   DOB: 10/04/1951  MR#: [id number]  OIN#:[id number]  PCP: Amber Cruel, MD/ Amber Foot Ouida Sills FNP GYN:  SU: Amber Skates MD OTHER MD: Amber Santos, Amber Santos, Amber Santos   HISTORY OF BREAST CANCER: From the original intake note:  Amber Santos had routine screening mammography at [facility] 05/23/2022 showing new calcifications in the left breast. Diagnostic left mammography 05/27/2012 confirmed a cluster of faint calcifications in the upper outer quadrant. Biopsy was performed 06/01/2012, and showed atypical ductal hyperplasia ([id number]). Accordingly the patient underwent left lumpectomy 06/17/2012. This showed ([id number]) invasive ductal carcinoma, grade 2, measuring 1.4 cm, with associated high-grade ductal carcinoma in situ. There were 2 additional areas of ductal carcinoma in situ, but margins were clear. The invasive tumor was 100% estrogen receptor and 100% progesterone receptor positive, with an MIB-1 of 20%. It was HER-2 negative.   On 06/26/2012 the patient underwent bilateral breast MRI. This found a seroma in the upper outer quadrant of the left breast measuring 6 cm. There was no other area of suspicious enhancement in the left breast, no findings of concern in the right breast, and no axillary or internal mammary adenopathy. The patient's subsequent history is as detailed below  INTERVAL HISTORY: Amber Santos returns today for followup of her estrogen receptor positive breast cancer. She continues on tamoxifen, with generally good tolerance. She does have hot flashes which bother her particularly at night. The gabapentin dropped out of her medication list for some reason. She does not have vaginal wetness issues with this. She obtains it at a good price.  REVIEW OF SYSTEMS: Amber Santos walks about 3 miles a day. A detailed review of systems today was otherwise noncontributory   PAST MEDICAL HISTORY: Past Medical History:  Diagnosis Date  . Anemia   .  Breast cancer ([place]) 2013   high grade ductal ca left breast    PAST SURGICAL HISTORY: Past Surgical History:  Procedure Laterality Date  . ANTERIOR CERVICAL DECOMP/DISCECTOMY FUSION  01/10/2009   C4-7  . AXILLARY LYMPH NODE BIOPSY  07/25/12   left  . BREAST LUMPECTOMY  06/17/2012   left partial mastectomy  . [place]  . CYSTOSCOPY N/A 05/01/2013   Procedure: CYSTOSCOPY;  Surgeon: Amber Speller, MD;  Location: [facility];  Service: Gynecology;  Laterality: N/A;  . ROBOTIC ASSISTED TOTAL HYSTERECTOMY WITH BILATERAL SALPINGO OOPHERECTOMY Bilateral 05/01/2013   Procedure: ROBOTIC ASSISTED TOTAL HYSTERECTOMY WITH BILATERAL SALPINGO OOPHORECTOMY;  Surgeon: Amber Speller, MD;  Location: [facility];  Service: Gynecology;  Laterality: Bilateral;    FAMILY HISTORY Family History  Problem Relation Age of Onset  . Breast cancer Mother 38  . Cancer Maternal Aunt     breast, pancreatic  . Colon cancer Maternal Uncle   . Breast cancer Maternal Aunt 28  . Colon cancer Maternal Grandmother   . Kidney cancer Maternal Uncle   . Breast cancer Cousin     2 maternal cousins dx in 55s   the patient's father died at the age of 26 from an accident. The patient's mother is living, currently 21 years old. She has a history of breast cancer, postmenopausal. The patient has one brother, no sisters. In addition the patient has a maternal aunt who was diagnosed with breast cancer at age 48 and a cousin who was diagnosed with breast cancer at age 22 (also found to be BRCA positive, followed at [facility]). She says 2 maternal uncles had a history of colon cancer.  GYNECOLOGIC HISTORY: Menarche age 67, first live birth age 69, which of course increases the risk of breast cancer. She is GX P2. 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 She is very active in [employer]. Her husband Amber Santos is V.P.  at [employer]. He had surgery for prostate cancer in 2014 Son Amber Santos lives in [place] and son Amber Santos lives in [place]. Both work for [employer]: In place  HEALTH MAINTENANCE: Social History  Substance Use Topics  . Smoking status: Former Smoker    Packs/day: 0.50    Years: 12.00    Quit date: 06/17/1984  . Smokeless tobacco: Never Used  . Alcohol use 1.8 oz/week    3 Shots of liquor per week     Comment: weekends, events     Colonoscopy: 2005?  PAP: 2012  Bone density: 04/12/2008/ [facility]l  Lipid panel:  Allergies  Allergen Reactions  . Codeine Other (See Comments)    headaches    Current Outpatient Prescriptions  Medication Sig Dispense Refill  . sulfamethoxazole-trimethoprim (BACTRIM DS) 800-160 MG tablet Take 1 tablet by mouth 2 (two) times daily. One PO BID x 3 days 6 tablet 0  . tamoxifen (NOLVADEX) 20 MG tablet TAKE 1 TABLET BY MOUTH EVERY DAY 90 tablet 1   No current facility-administered medications for this visit.     OBJECTIVE: Middle-aged white woman In no acute distress  Vitals:   06/16/16 1012  BP: 126/78  Pulse: 72  Resp: 18  Temp: 98.7 F (37.1 C)     Body mass index is 31.07 kg/m.    ECOG FS: 0  Sclerae unicteric, EOMs intact Oropharynx clear and moist No cervical or supraclavicular adenopathy Lungs no rales or rhonchi Heart regular rate and rhythm Abd soft, nontender, positive bowel sounds MSK no focal spinal tenderness, no upper extremity lymphedema Neuro: nonfocal, well oriented, appropriate affect Breasts: The right breast is benign. In the inframammary fold there is a 2 x 1 cm slightly erythematous lesion which is photographed below the right axilla is benign. The left breast is status post lumpectomy and radiation. There is no evidence of local recurrence. The left axilla is benign.  Photo right inframammary fold 06/16/2016    LAB RESULTS: Lab Results  Component Value Date   WBC 4.1  06/16/2016   NEUTROABS 2.2 06/16/2016   HGB 12.4 06/16/2016   HCT 38.2 06/16/2016   MCV 82.8 06/16/2016   PLT 219 06/16/2016      Chemistry      Component Value Date/Time   NA 141 06/24/2015 0809      Component Value Date/Time   CALCIUM 8.8 06/24/2015 0809       No results found for: LABCA2  No components found for: [id number]  No results for input(s): INR in the last 168 hours.  Urinalysis No results found for: COLORURINE  STUDIES: CLINICAL DATA:  65 year old female with history of left breast cancer post lumpectomy in 2013 followed by radiation therapy.  EXAM: 2D DIGITAL DIAGNOSTIC BILATERAL MAMMOGRAM WITH CAD AND ADJUNCT TOMO  COMPARISON:  Previous exam(s).  ACR Breast Density Category b: There are scattered areas of fibroglandular density.  FINDINGS: No suspicious masses or calcifications are seen in either breast. Postsurgical changes are present in the upper outer far posterior left breast related to prior lumpectomy. Spot compression magnification CC view of the lumpectomy site in the left breast  was performed demonstrating expected postsurgical change with developing dystrophic type calcifications. There is no mammographic evidence of locally recurrent malignancy.  Mammographic images were processed with CAD.  IMPRESSION: No mammographic evidence of malignancy in either breast.  RECOMMENDATION: Diagnostic mammogram is suggested in 1 year. (Code:DM-B-01Y)  I have discussed the findings and recommendations with the patient. Results were also provided in writing at the conclusion of the visit. If applicable, a reminder letter will be sent to the patient regarding the next appointment.  BI-RADS CATEGORY  2: Benign.   Electronically Signed   By: Everlean Alstrom M.D.   On: 06/05/2016 08:16  ASSESSMENT: 65 y.o. BRCA-2 positive [place] woman status post left Breast upper outer quadrant lumpectomy with no axillary lymph node sampling  06/17/2012 for a pT1c pNX, clinical stage IA invasive ductal carcinoma, grade 2, 100% estrogen and 100% progesterone receptor positive, HER-2 negative, with an MIB-1 of 16%.  (1) left sentinel lymph node biopsy 07/25/2012 showed 0 of 3 lymph nodes positive  (2) Oncotype DX shows a recurrence score of 21, predicting a distant recurrence risk within 10 years of 14% if the patient's only systemic treatment is tamoxifen for 5 years  (3) adjuvant radiation therapy completed 10/03/2012  (4) tamoxifen started January 2014; the plan is to continue to January 2024  (5) BRCA-2 positive  (a) s/p TAH-BSO 05/01/2013  (b) to have yearly breast MRI in addition to mammography  PLAN: Katiya is 4 years out from definitive surgery for her breast cancer with no evidence of disease recurrence. This is very favorable.  We again discussed the MRI issue. Her breast density is now category B which means mammogram is more sensitive. Nevertheless I still recommend MRI yearly and after much discussion we agreed that we would do one MRI later this year, and if there is no significant difference from the mammography with tomography we would probably save future MRIs for any change in the breast.  There is a slightly reddish lesion in the inframammary fold on the right which I photographed today. I think this is probably benign but it would be a good idea if she brought it to her dermatologist's attention.  She is still having nighttime hot flashes. I have refilled her gabapentin at bedtime.  Otherwise she will see me again in one year. She knows to call for any problems that may develop that visit.     MAGRINAT,GUSTAV C    06/16/2016

## 2016-06-24 ENCOUNTER — Other Ambulatory Visit: Payer: BC Managed Care – PPO

## 2016-06-29 ENCOUNTER — Ambulatory Visit: Payer: BC Managed Care – PPO

## 2016-06-29 ENCOUNTER — Other Ambulatory Visit: Payer: BC Managed Care – PPO | Admitting: Oncology

## 2016-11-02 ENCOUNTER — Other Ambulatory Visit: Payer: Self-pay | Admitting: *Deleted

## 2016-11-02 MED ORDER — TAMOXIFEN CITRATE 20 MG PO TABS: 20.0000 mg | ORAL_TABLET | Freq: Every day | ORAL | 1 refills | Status: DC

## 2017-05-03 ENCOUNTER — Other Ambulatory Visit: Payer: Self-pay | Admitting: Emergency Medicine

## 2017-05-03 MED ORDER — TAMOXIFEN CITRATE 20 MG PO TABS: 20.0000 mg | ORAL_TABLET | Freq: Every day | ORAL | 1 refills | Status: DC

## 2017-05-13 ENCOUNTER — Other Ambulatory Visit: Payer: Self-pay | Admitting: Oncology

## 2017-05-13 DIAGNOSIS — Z853 Personal history of malignant neoplasm of breast: Secondary | ICD-10-CM

## 2017-06-03 ENCOUNTER — Telehealth: Payer: Self-pay | Admitting: Obstetrics & Gynecology

## 2017-06-03 NOTE — Telephone Encounter (Signed)
Patient called thinks she may have a UTI.  Offered her several appointments patient declined and states she will call her PCP.

## 2017-06-09 ENCOUNTER — Other Ambulatory Visit (HOSPITAL_BASED_OUTPATIENT_CLINIC_OR_DEPARTMENT_OTHER): Payer: BC Managed Care – PPO

## 2017-06-09 DIAGNOSIS — C50412 Malignant neoplasm of upper-outer quadrant of left female breast: Secondary | ICD-10-CM

## 2017-06-09 LAB — CBC WITH DIFFERENTIAL/PLATELET
BASO%: 0.5 % (ref 0.0–2.0)
BASOS ABS: 0 10*3/uL (ref 0.0–0.1)
EOS ABS: 0.1 10*3/uL (ref 0.0–0.5)
EOS%: 1.4 % (ref 0.0–7.0)
HCT: 37.2 % (ref 34.8–46.6)
HGB: 12.2 g/dL (ref 11.6–15.9)
LYMPH%: 28 % (ref 14.0–49.7)
MCH: 27 pg (ref 25.1–34.0)
MCHC: 32.8 g/dL (ref 31.5–36.0)
MCV: 82.3 fL (ref 79.5–101.0)
MONO#: 0.6 10*3/uL (ref 0.1–0.9)
MONO%: 11.1 % (ref 0.0–14.0)
NEUT#: 3 10*3/uL (ref 1.5–6.5)
NEUT%: 59 % (ref 38.4–76.8)
PLATELETS: 208 10*3/uL (ref 145–400)
RBC: 4.51 10*6/uL (ref 3.70–5.45)
RDW: 13.1 % (ref 11.2–14.5)
WBC: 5.1 10*3/uL (ref 3.9–10.3)
lymph#: 1.4 10*3/uL (ref 0.9–3.3)

## 2017-06-09 LAB — COMPREHENSIVE METABOLIC PANEL
ALT: 23 U/L (ref 0–55)
ANION GAP: 7 meq/L (ref 3–11)
AST: 29 U/L (ref 5–34)
Albumin: 3.7 g/dL (ref 3.5–5.0)
Alkaline Phosphatase: 44 U/L (ref 40–150)
BUN: 19.6 mg/dL (ref 7.0–26.0)
CALCIUM: 9.6 mg/dL (ref 8.4–10.4)
CHLORIDE: 107 meq/L (ref 98–109)
CO2: 26 mEq/L (ref 22–29)
CREATININE: 0.8 mg/dL (ref 0.6–1.1)
EGFR: 77 mL/min/{1.73_m2} — ABNORMAL LOW (ref >90)
Glucose: 97 mg/dl (ref 70–140)
POTASSIUM: 4.6 meq/L (ref 3.5–5.1)
Sodium: 140 mEq/L (ref 136–145)
Total Bilirubin: 0.55 mg/dL (ref 0.20–1.20)
Total Protein: 7.1 g/dL (ref 6.4–8.3)

## 2017-06-10 ENCOUNTER — Ambulatory Visit
Admission: RE | Admit: 2017-06-10 | Discharge: 2017-06-10 | Disposition: A | Discharge status: Home / Self Care | Payer: BC Managed Care – PPO | Source: Ambulatory Visit | Attending: Oncology | Admitting: Oncology

## 2017-06-10 DIAGNOSIS — Z853 Personal history of malignant neoplasm of breast: Secondary | ICD-10-CM

## 2017-06-16 ENCOUNTER — Telehealth: Payer: Self-pay | Admitting: Oncology

## 2017-06-16 ENCOUNTER — Ambulatory Visit (HOSPITAL_BASED_OUTPATIENT_CLINIC_OR_DEPARTMENT_OTHER): Payer: BC Managed Care – PPO | Admitting: Oncology

## 2017-06-16 VITALS — BP 131/79 | HR 65 | Temp 97.9°F | Resp 18 | Ht 67.0 in | Wt 195.4 lb

## 2017-06-16 DIAGNOSIS — Z79810 Long term (current) use of selective estrogen receptor modulators (SERMs): Secondary | ICD-10-CM | POA: Diagnosis not present

## 2017-06-16 DIAGNOSIS — Z170 Estrogen receptor positive status [ER+]: Secondary | ICD-10-CM | POA: Diagnosis not present

## 2017-06-16 DIAGNOSIS — C50412 Malignant neoplasm of upper-outer quadrant of left female breast: Secondary | ICD-10-CM | POA: Diagnosis not present

## 2017-06-16 NOTE — Progress Notes (Signed)
ID: Amber Santos   DOB: August 01, 1951  MR#: [id number]  OXB#:[id number]  PCP: Chauncey Cruel, MD/ Epimenio Foot Ouida Sills FNP GYN:  SU: Fanny Skates MD OTHER MD: Lance Morin, Delfin Edis, Peter Martinique   HISTORY OF BREAST CANCER: From the earlier summary note:  Ronnell had routine screening mammography at [facility] 05/23/2022 showing new calcifications in the left breast. Diagnostic left mammography 05/27/2012 confirmed a cluster of faint calcifications in the upper outer quadrant. Biopsy was performed 06/01/2012, and showed atypical ductal hyperplasia ([id number] [id number]). Accordingly the patient underwent left lumpectomy 06/17/2012. This showed ([id number] [id number]) invasive ductal carcinoma, grade 2, measuring 1.4 cm, with associated high-grade ductal carcinoma in situ. There were 2 additional areas of ductal carcinoma in situ, but margins were clear. The invasive tumor was 100% estrogen receptor and 100% progesterone receptor positive, with an MIB-1 of 20%. It was HER-2 negative.   On 06/26/2012 the patient underwent bilateral breast MRI. This found a seroma in the upper outer quadrant of the left breast measuring 6 cm. There was no other area of suspicious enhancement in the left breast, no findings of concern in the right breast, and no axillary or internal mammary adenopathy. The patient's subsequent history is as detailed below  INTERVAL HISTORY: Amber Santos returns today for follow-up and treatment of her estrogen receptor positive breast cancer. She continues on tamoxifen, with good tolerance. She does currently have some worsened nighttime hot flashes/sweats. She does have gabapentin on hand but has preferred not to use it. Aside from that she tolerates it without any unusual side effects  REVIEW OF SYSTEMS: Amber Santos's mother is currently in the [facility] [place] [facility] unit. Graham visits very frequently. Amber Santos exercises regularly, walking about 3 miles most days, and [facility]. Aside  from this "everything is going well", her son recently got married, and a detailed review of systems was noncontributory  PAST MEDICAL HISTORY: Past Medical History:  Diagnosis Date  . Anemia   . Breast cancer ([place]) 2013   high grade ductal ca left breast    PAST SURGICAL HISTORY: Past Surgical History:  Procedure Laterality Date  . ANTERIOR CERVICAL DECOMP/DISCECTOMY FUSION  01/10/2009   C4-7  . AXILLARY LYMPH NODE BIOPSY  07/25/12   left  . BREAST LUMPECTOMY  06/17/2012   left partial mastectomy ,radiation  . [place]  . CYSTOSCOPY N/A 05/01/2013   Procedure: CYSTOSCOPY;  Surgeon: Lyman Speller, MD;  Location: [facility];  Service: Gynecology;  Laterality: N/A;  . ROBOTIC ASSISTED TOTAL HYSTERECTOMY WITH BILATERAL SALPINGO OOPHERECTOMY Bilateral 05/01/2013   Procedure: ROBOTIC ASSISTED TOTAL HYSTERECTOMY WITH BILATERAL SALPINGO OOPHORECTOMY;  Surgeon: Lyman Speller, MD;  Location: [facility];  Service: Gynecology;  Laterality: Bilateral;    FAMILY HISTORY Family History  Problem Relation Age of Onset  . Breast cancer Mother 57  . Cancer Maternal Aunt        breast, pancreatic  . Colon cancer Maternal Uncle   . Breast cancer Maternal Uncle   . Breast cancer Maternal Aunt 28  . Colon cancer Maternal Grandmother   . Kidney cancer Maternal Uncle   . Breast cancer Cousin        2 maternal cousins dx in 67s   the patient's father died at the age of 80 from an accident. The patient's mother is living, currently 71 years old. She has a history of breast cancer, postmenopausal. The patient has one brother, no sisters. In addition the patient has  a maternal aunt who was diagnosed with breast cancer at age 50 and a cousin who was diagnosed with breast cancer at age 32 (also found to be BRCA positive, followed at [facility]). She says 2 maternal uncles had a history of colon cancer.  GYNECOLOGIC HISTORY: Menarche age 21, first live birth age 73, which of course increases  the risk of breast cancer. She is GX P2. 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 She is very active in [employer]. Her husband [name] is V.P. at [employer]. He had surgery for prostate cancer in 2014 Son [name] lives in [place] and son [name] lives in [place]. Both work for [employer]: In place  HEALTH MAINTENANCE: Social History  Substance Use Topics  . Smoking status: Former Smoker    Packs/day: 0.50    Years: 12.00    Quit date: 06/17/1984  . Smokeless tobacco: Never Used  . Alcohol use 1.8 oz/week    3 Shots of liquor per week     Comment: weekends, events     Colonoscopy: 2005?  PAP: 2012  Bone density: 04/12/2008/ [facility]  Lipid panel:  Allergies  Allergen Reactions  . Codeine Other (See Comments)    headaches    Current Outpatient Prescriptions  Medication Sig Dispense Refill  . gabapentin (NEURONTIN) 100 MG capsule Take 1 capsule (100 mg total) by mouth at bedtime. 90 capsule 4  . tamoxifen (NOLVADEX) 20 MG tablet Take 1 tablet (20 mg total) by mouth daily. 90 tablet 1   No current facility-administered medications for this visit.     OBJECTIVE: Middle-aged white woman In no acute distress Vitals:   06/16/17 1153  BP: 131/79  Pulse: 65  Resp: 18  Temp: 97.9 F (36.6 C)  SpO2: 100%     Body mass index is 30.6 kg/m.    ECOG FS: 0  Sclerae unicteric, EOMs intact Oropharynx clear and moist No cervical or supraclavicular adenopathy Lungs no rales or rhonchi Heart regular rate and rhythm Abd soft, nontender, positive bowel sounds MSK no focal spinal tenderness, no upper extremity lymphedema Neuro: nonfocal, well oriented, appropriate affect Breasts: The right breast is unremarkable. The left breast as undergone lumpectomy followed by radiation. There is no evidence of local recurrence. Both axillae  are benign.   LAB RESULTS: Lab Results  Component Value Date   WBC 5.1 06/09/2017   NEUTROABS 3.0 06/09/2017   HGB 12.2 06/09/2017   HCT 37.2 06/09/2017   MCV 82.3 06/09/2017   PLT 208 06/09/2017      Chemistry      Component Value Date/Time   NA 140 06/09/2017 1217      Component Value Date/Time   CALCIUM 9.6 06/09/2017 1217       No results found for: LABCA2  No components found for: [id number]  No results for input(s): INR in the last 168 hours.  Urinalysis No results found for: COLORURINE  STUDIES: Bilateral diagnostic mammography with tomography at the [facility] 06/10/2017 found the breast density to be category B. There was no evidence of malignancy.   ASSESSMENT: 66 y.o. BRCA-2 positive [place] woman status post left lumpectomy with no axillary lymph node sampling 06/17/2012 for a pT1c pNX, clinical stage IA invasive ductal carcinoma, grade 2, 100% estrogen and 100% progesterone receptor positive, HER-2 negative, with an MIB-1 of 16%.  (1) left sentinel lymph node biopsy 07/25/2012  showed 0 of 3 lymph nodes positive  (2) Oncotype DX shows a recurrence score of 21, predicting a distant recurrence risk within 10 years of 14% if the patient's only systemic treatment is tamoxifen for 5 years  (3) adjuvant radiation therapy completed 10/03/2012  (4) tamoxifen started January 2014; the plan is to continue to January 2024  (5) BRCA-2 positive  (a) s/p TAH-BSO 05/01/2013  (b) to have yearly breast MRI in addition to mammography  PLAN: Leilani is now 5 years out from definitive surgery for her breast cancer with no evidence of disease recurrence. This is very favorable.  She continues on tamoxifen, with good tolerance. We reviewed her Oncotype results. I quoted her a residual risk of recurrence at this point in the 8% range. I think tamoxifen another 5 years might drop that another 2% or so  She is very motivated to continue tamoxifen. She tolerates it  well and obtain said at a good price. Accordingly that is a reasonable choice  I did suggest she give the gabapentin at chance with her nighttime hot flashes.  The plan then is for tamoxifen for 10 years. She will see me again a year from now. She knows to call for any problems that may develop with her next visit here.     Charlie Seda C    06/16/2017

## 2017-06-16 NOTE — Telephone Encounter (Signed)
Scheduled appts. Patient did not want avs or calendar.

## 2017-07-19 ENCOUNTER — Other Ambulatory Visit: Payer: Self-pay | Admitting: Oncology

## 2017-10-28 ENCOUNTER — Other Ambulatory Visit: Payer: Self-pay | Admitting: Oncology

## 2018-01-08 ENCOUNTER — Other Ambulatory Visit: Payer: Self-pay | Admitting: Oncology

## 2018-01-19 ENCOUNTER — Other Ambulatory Visit: Payer: Self-pay | Admitting: *Deleted

## 2018-01-19 ENCOUNTER — Encounter: Payer: Self-pay | Admitting: Obstetrics & Gynecology

## 2018-01-19 ENCOUNTER — Other Ambulatory Visit: Payer: Self-pay

## 2018-01-19 ENCOUNTER — Ambulatory Visit: Payer: BC Managed Care – PPO | Admitting: Obstetrics & Gynecology

## 2018-01-19 VITALS — BP 118/70 | HR 64 | Resp 16 | Ht 67.5 in | Wt 196.4 lb

## 2018-01-19 DIAGNOSIS — R8290 Unspecified abnormal findings in urine: Secondary | ICD-10-CM

## 2018-01-19 DIAGNOSIS — Z1509 Genetic susceptibility to other malignant neoplasm: Principal | ICD-10-CM

## 2018-01-19 DIAGNOSIS — Z1501 Genetic susceptibility to malignant neoplasm of breast: Secondary | ICD-10-CM

## 2018-01-19 DIAGNOSIS — Z01419 Encounter for gynecological examination (general) (routine) without abnormal findings: Secondary | ICD-10-CM

## 2018-01-19 DIAGNOSIS — Z1231 Encounter for screening mammogram for malignant neoplasm of breast: Secondary | ICD-10-CM

## 2018-01-19 DIAGNOSIS — Z853 Personal history of malignant neoplasm of breast: Secondary | ICD-10-CM

## 2018-01-19 MED ORDER — TRIAMCINOLONE ACETONIDE 0.5 % EX OINT: 1.0000 "application " | TOPICAL_OINTMENT | Freq: Two times a day (BID) | CUTANEOUS | 0 refills | Status: DC

## 2018-01-19 NOTE — Progress Notes (Signed)
67 y.o. G2P2 MarriedCaucasianF here for annual exam.  Doing well.  Denies vaginal bleeding.    Diagnosed with breast cancer 8/13 with atypical ductal hyperplasia.  Had lumpectomy showing invasive ductal carcinoma, grad 2.  Was ER+/PR+, her 2 negative.  Genetic testing showed BRCA-2 positive status.  Has robotic TLH/BSO 7/14.  Will be on Tamoxifen for 10 years.    Having some issues with changes in urine and urine odor.  Had experienced several UTIs in the past few years.    Also has an area on her labia that is tender and almost "lumpy" at times.  Does have some itching and burning as well.  Mother passed in October.  She was 90.  Had Alzheimer's.     Husband had prostate cancer and had surgery.  Then had recurrence and radiation but doing ok.    Patient's last menstrual period was 10/05/2004 (approximate).          Sexually active: No.  The current method of family planning is status post hysterectomy.    Exercising: Yes.    pilates, elliptical, weights Smoker:  no  Health Maintenance: Pap:  2013 normal  History of abnormal Pap:  no  MMG:  06/10/17 BIRADS2:Benign. F/u 1 year  Colonoscopy:  09/14/14 Normal. F/u 10 years  BMD:   04/2008 Normal  TDaP:  2014 Pneumonia vaccine(s):  n/a Shingrix:   No.  D/w pt having this done. Hep C testing: Unsure Screening Labs: Here today   reports that she quit smoking about 33 years ago. She has a 6.00 pack-year smoking history. She has never used smokeless tobacco. She reports that she drinks about 1.8 oz of alcohol per week. She reports that she does not use drugs.  Past Medical History:  Diagnosis Date  . Anemia   . Breast cancer ([place]) 2013   high grade ductal ca left breast  . Urinary incontinence     Past Surgical History:  Procedure Laterality Date  . ANTERIOR CERVICAL DECOMP/DISCECTOMY FUSION  01/10/2009   C4-7  . AXILLARY LYMPH NODE BIOPSY  07/25/12   left  . BREAST LUMPECTOMY  06/17/2012   left partial mastectomy ,radiation  .  [place]  . CYSTOSCOPY N/A 05/01/2013   Procedure: CYSTOSCOPY;  Surgeon: Lyman Speller, MD;  Location: [facility];  Service: Gynecology;  Laterality: N/A;  . ROBOTIC ASSISTED TOTAL HYSTERECTOMY WITH BILATERAL SALPINGO OOPHERECTOMY Bilateral 05/01/2013   Procedure: ROBOTIC ASSISTED TOTAL HYSTERECTOMY WITH BILATERAL SALPINGO OOPHORECTOMY;  Surgeon: Lyman Speller, MD;  Location: [facility];  Service: Gynecology;  Laterality: Bilateral;    Current Outpatient Medications  Medication Sig Dispense Refill  . tamoxifen (NOLVADEX) 20 MG tablet TAKE 1 TABLET DAILY 90 tablet 1   No current facility-administered medications for this visit.     Family History  Problem Relation Age of Onset  . Breast cancer Mother 6  . Cancer Maternal Aunt        breast, pancreatic  . Colon cancer Maternal Uncle   . Breast cancer Maternal Uncle   . Breast cancer Maternal Aunt 28  . Colon cancer Maternal Grandmother   . Kidney cancer Maternal Uncle   . Breast cancer Cousin        2 maternal cousins dx in 39s  . Breast cancer Cousin     Review of Systems  All other systems reviewed and are negative.   Exam:   BP 118/70   Pulse 64   Resp 16   Ht  5' 7.5" (1.715 m)   Wt 196 lb 6.4 oz (89.1 kg)   LMP 10/05/2004 (Approximate)   BMI 30.31 kg/m   Height:   Height: 5' 7.5" (171.5 cm)  Ht Readings from Last 3 Encounters:  01/19/18 5' 7.5" (1.715 m)  06/16/17 '5\' 7"'$  (1.702 m)  06/16/16 '5\' 7"'$  (1.702 m)    General appearance: alert, cooperative and appears stated age Head: Normocephalic, without obvious abnormality, atraumatic Neck: no adenopathy, supple, symmetrical, trachea midline and thyroid normal to inspection and palpation Lungs: clear to auscultation bilaterally Breasts: normal appearance, no masses or tenderness Heart: regular rate and rhythm Abdomen: soft, non-tender; bowel sounds normal; no masses,  no organomegaly Extremities: extremities normal, atraumatic, no cyanosis or  edema Skin: Skin color, texture, turgor normal. No rashes or lesions Lymph nodes: Cervical, supraclavicular, and axillary nodes normal. No abnormal inguinal nodes palpated Neurologic: Grossly normal   Pelvic: External genitalia:  Very small area of hypopigmentation with thickening of tissue on right external labia majora (see picture below)              Urethra:  normal appearing urethra with no masses, tenderness or lesions              Bartholins and Skenes: normal                 Vagina: normal appearing vagina with normal color and discharge, no lesions              Cervix: absent              Pap taken: No. Bimanual Exam:  Uterus:  uterus absent              Adnexa: no mass, fullness, tenderness               Rectovaginal: Confirms               Anus:  normal sphincter tone, no lesions     Chaperone was present for exam.  A:  Well Woman with normal exam PMP, no HRT H/o invasive ductal breast cancer, ER+/PR+, her 2 nu negative, s/p lumpectomy and radiation BRCA 2+ H/O TLH/BSO Very small area of vulvar skin changes Urine odor and color changes  P:   Mammogram guidelines reviewed.  Feel she should be doing yearly MRI.  Pt comfortable with this.  Will see if can help get this scheduled for her. Triamcinolone 0.5% ointment BID for a month.  Recheck one month Urine micro and urine culture Return annually or prn

## 2018-01-20 LAB — URINALYSIS, MICROSCOPIC ONLY

## 2018-01-20 LAB — URINE CULTURE

## 2018-01-25 ENCOUNTER — Ambulatory Visit
Admission: RE | Admit: 2018-01-25 | Discharge: 2018-01-25 | Disposition: A | Discharge status: Home / Self Care | Payer: BC Managed Care – PPO | Source: Ambulatory Visit | Attending: Obstetrics & Gynecology | Admitting: Obstetrics & Gynecology

## 2018-01-25 DIAGNOSIS — Z853 Personal history of malignant neoplasm of breast: Secondary | ICD-10-CM

## 2018-01-25 DIAGNOSIS — Z1501 Genetic susceptibility to malignant neoplasm of breast: Secondary | ICD-10-CM

## 2018-01-25 DIAGNOSIS — Z1509 Genetic susceptibility to other malignant neoplasm: Principal | ICD-10-CM

## 2018-01-25 MED ORDER — GADOBENATE DIMEGLUMINE 529 MG/ML IV SOLN
18.0000 mL | Freq: Once | INTRAVENOUS | Status: AC | PRN
  Administered 2018-01-25: 18 mL via INTRAVENOUS

## 2018-04-21 ENCOUNTER — Encounter: Payer: BC Managed Care – PPO | Admitting: Obstetrics & Gynecology

## 2018-04-21 ENCOUNTER — Encounter

## 2018-04-29 ENCOUNTER — Other Ambulatory Visit: Payer: Self-pay | Admitting: Hematology and Oncology

## 2018-06-14 ENCOUNTER — Telehealth: Payer: Self-pay | Admitting: Oncology

## 2018-06-14 NOTE — Telephone Encounter (Signed)
GM PAL 9/18 - moved 9/18 f/u from [facility] to [place]. Spoke with patient and patient wants to see GM. Per patient December ok. Follow up with GM and associated lab moved to December per patient request. Patient has new date/time for December lab and f/u.

## 2018-06-21 ENCOUNTER — Other Ambulatory Visit: Payer: BC Managed Care – PPO

## 2018-06-22 ENCOUNTER — Ambulatory Visit: Payer: BC Managed Care – PPO | Admitting: Adult Health

## 2018-07-11 ENCOUNTER — Other Ambulatory Visit: Payer: Self-pay | Admitting: Oncology

## 2018-07-11 DIAGNOSIS — Z853 Personal history of malignant neoplasm of breast: Secondary | ICD-10-CM

## 2018-07-15 ENCOUNTER — Other Ambulatory Visit: Payer: Self-pay | Admitting: Oncology

## 2018-07-15 ENCOUNTER — Other Ambulatory Visit: Payer: Self-pay

## 2018-07-15 DIAGNOSIS — Z853 Personal history of malignant neoplasm of breast: Secondary | ICD-10-CM

## 2018-07-18 ENCOUNTER — Other Ambulatory Visit: Payer: Self-pay | Admitting: Adult Health

## 2018-07-18 ENCOUNTER — Other Ambulatory Visit: Payer: Self-pay

## 2018-07-18 ENCOUNTER — Other Ambulatory Visit: Payer: Self-pay | Admitting: Oncology

## 2018-07-18 DIAGNOSIS — Z853 Personal history of malignant neoplasm of breast: Secondary | ICD-10-CM

## 2018-07-19 ENCOUNTER — Ambulatory Visit
Admission: RE | Admit: 2018-07-19 | Discharge: 2018-07-19 | Disposition: A | Discharge status: Home / Self Care | Payer: Medicare Other | Source: Ambulatory Visit | Attending: Oncology | Admitting: Oncology

## 2018-07-19 DIAGNOSIS — Z853 Personal history of malignant neoplasm of breast: Secondary | ICD-10-CM

## 2018-07-19 HISTORY — DX: Personal history of irradiation: Z92.3

## 2018-09-12 ENCOUNTER — Inpatient Hospital Stay: Payer: Medicare Other | Attending: Oncology

## 2018-09-12 DIAGNOSIS — C50412 Malignant neoplasm of upper-outer quadrant of left female breast: Secondary | ICD-10-CM

## 2018-09-12 DIAGNOSIS — Z79810 Long term (current) use of selective estrogen receptor modulators (SERMs): Secondary | ICD-10-CM | POA: Diagnosis not present

## 2018-09-12 DIAGNOSIS — Z90710 Acquired absence of both cervix and uterus: Secondary | ICD-10-CM | POA: Insufficient documentation

## 2018-09-12 DIAGNOSIS — Z8051 Family history of malignant neoplasm of kidney: Secondary | ICD-10-CM | POA: Diagnosis not present

## 2018-09-12 DIAGNOSIS — Z170 Estrogen receptor positive status [ER+]: Secondary | ICD-10-CM | POA: Diagnosis not present

## 2018-09-12 DIAGNOSIS — Z923 Personal history of irradiation: Secondary | ICD-10-CM | POA: Insufficient documentation

## 2018-09-12 DIAGNOSIS — D0512 Intraductal carcinoma in situ of left breast: Secondary | ICD-10-CM | POA: Diagnosis not present

## 2018-09-12 DIAGNOSIS — Z87891 Personal history of nicotine dependence: Secondary | ICD-10-CM | POA: Diagnosis not present

## 2018-09-12 DIAGNOSIS — Z800 Family history of malignant neoplasm of digestive organs: Secondary | ICD-10-CM | POA: Diagnosis not present

## 2018-09-12 DIAGNOSIS — Z79899 Other long term (current) drug therapy: Secondary | ICD-10-CM | POA: Insufficient documentation

## 2018-09-12 DIAGNOSIS — Z803 Family history of malignant neoplasm of breast: Secondary | ICD-10-CM | POA: Insufficient documentation

## 2018-09-12 LAB — CBC WITH DIFFERENTIAL/PLATELET
ABS IMMATURE GRANULOCYTES: 0.01 10*3/uL (ref 0.00–0.07)
BASOS PCT: 1 %
Basophils Absolute: 0 10*3/uL (ref 0.0–0.1)
Eosinophils Absolute: 0.1 10*3/uL (ref 0.0–0.5)
Eosinophils Relative: 2 %
HCT: 36.6 % (ref 36.0–46.0)
HEMOGLOBIN: 11.7 g/dL — AB (ref 12.0–15.0)
IMMATURE GRANULOCYTES: 0 %
Lymphocytes Relative: 30 %
Lymphs Abs: 1.2 10*3/uL (ref 0.7–4.0)
MCH: 26.8 pg (ref 26.0–34.0)
MCHC: 32 g/dL (ref 30.0–36.0)
MCV: 83.9 fL (ref 80.0–100.0)
MONO ABS: 0.5 10*3/uL (ref 0.1–1.0)
MONOS PCT: 12 %
NEUTROS ABS: 2.3 10*3/uL (ref 1.7–7.7)
NEUTROS PCT: 55 %
PLATELETS: 207 10*3/uL (ref 150–400)
RBC: 4.36 MIL/uL (ref 3.87–5.11)
RDW: 12.9 % (ref 11.5–15.5)
WBC: 4.1 10*3/uL (ref 4.0–10.5)
nRBC: 0 % (ref 0.0–0.2)

## 2018-09-12 LAB — COMPREHENSIVE METABOLIC PANEL
ALBUMIN: 3.8 g/dL (ref 3.5–5.0)
ALT: 20 U/L (ref 0–44)
ANION GAP: 8 (ref 5–15)
AST: 26 U/L (ref 15–41)
Alkaline Phosphatase: 46 U/L (ref 38–126)
BILIRUBIN TOTAL: 0.6 mg/dL (ref 0.3–1.2)
BUN: 15 mg/dL (ref 8–23)
CHLORIDE: 108 mmol/L (ref 98–111)
CO2: 24 mmol/L (ref 22–32)
Calcium: 9.2 mg/dL (ref 8.9–10.3)
Creatinine, Ser: 0.75 mg/dL (ref 0.44–1.00)
GFR calc Af Amer: >60 mL/min (ref >60)
GFR calc non Af Amer: >60 mL/min (ref >60)
GLUCOSE: 106 mg/dL — AB (ref 70–99)
POTASSIUM: 3.9 mmol/L (ref 3.5–5.1)
SODIUM: 140 mmol/L (ref 135–145)
TOTAL PROTEIN: 7 g/dL (ref 6.5–8.1)

## 2018-09-12 NOTE — Progress Notes (Signed)
ID: Amber Santos   DOB: 08-30-1951  MR#: [id number]  BOF#:[id number]  PCP: Amber Aly, FNP/ Amber Santos. Amber Sills FNP GYN:  SU: Amber Skates MD OTHER MD: Amber Santos, Amber Santos, Amber Santos, Amber Santos   HISTORY OF BREAST CANCER: From the earlier summary note:  Amber Santos had routine screening mammography at [facility] 05/23/2022 showing new calcifications in the left breast. Diagnostic left mammography 05/27/2012 confirmed a cluster of faint calcifications in the upper outer quadrant. Biopsy was performed 06/01/2012, and showed atypical ductal hyperplasia ([id number]). Accordingly the patient underwent left lumpectomy 06/17/2012. This showed ([id number]) invasive ductal carcinoma, grade 2, measuring 1.4 cm, with associated high-grade ductal carcinoma in situ. There were 2 additional areas of ductal carcinoma in situ, but margins were clear. The invasive tumor was 100% estrogen receptor and 100% progesterone receptor positive, with an MIB-1 of 20%. It was HER-2 negative.   On 06/26/2012 the patient underwent bilateral breast MRI. This found a seroma in the upper outer quadrant of the left breast measuring 6 cm. There was no other area of suspicious enhancement in the left breast, no findings of concern in the right breast, and no axillary or internal mammary adenopathy. The patient's subsequent history is as detailed below  INTERVAL HISTORY: Amber Santos returns today for follow-up of her estrogen receptor positive breast cancer.   The patient continues on tamoxifen, which she is tolerating well. She is having hot flashes, mostly at night. She takes Gabapentin if she is having a difficult time sleeping from the hot flashes.    Since her last visit here, she underwent a digital diagnostic bilateral mammogram with tomography showing: Breast Density Category B. There are stable postsurgical changes in the upper outer posterior left breast. There is no mammographic evidence of malignancy  in either breast.    REVIEW OF SYSTEMS: Amber Santos is doing well overall. She has not been as involved with [facility]; her confidence level has gone down since [facility] took over. For exercise she is working with a Physiological scientist twice a week. She has pilates on Mondays and she is walking at the [facility]. The patient denies unusual headaches, visual changes, nausea, vomiting, or dizziness. There has been no unusual cough, phlegm production, or pleurisy. This been no change in bowel or bladder habits. The patient denies unexplained fatigue or unexplained weight loss, bleeding, rash, or fever. A detailed review of systems was otherwise noncontributory.    PAST MEDICAL HISTORY: Past Medical History:  Diagnosis Date  . Anemia   . Breast cancer ([place]) 2013   high grade ductal ca left breast  . Personal history of radiation therapy 2013   Left Breast Cancer  . Urinary incontinence     PAST SURGICAL HISTORY: Past Surgical History:  Procedure Laterality Date  . ANTERIOR CERVICAL DECOMP/DISCECTOMY FUSION  01/10/2009   C4-7  . AXILLARY LYMPH NODE BIOPSY  07/25/12   left  . BREAST LUMPECTOMY Left 06/17/2012   left partial mastectomy ,radiation  . [place]  . CYSTOSCOPY N/A 05/01/2013   Procedure: CYSTOSCOPY;  Surgeon: Amber Speller, MD;  Location: [facility];  Service: Gynecology;  Laterality: N/A;  . ROBOTIC ASSISTED TOTAL HYSTERECTOMY WITH BILATERAL SALPINGO OOPHERECTOMY Bilateral 05/01/2013   Procedure: ROBOTIC ASSISTED TOTAL HYSTERECTOMY WITH BILATERAL SALPINGO OOPHORECTOMY;  Surgeon: Amber Speller, MD;  Location: [facility];  Service: Gynecology;  Laterality: Bilateral;    FAMILY HISTORY Family History  Problem Relation Age of Onset  . Breast cancer Mother 34  .  Cancer Maternal Aunt        breast, pancreatic  . Colon cancer Maternal Uncle   . Breast cancer Maternal Uncle   . Breast cancer Maternal Aunt 28  . Colon cancer Maternal Grandmother   . Kidney cancer Maternal Uncle    . Breast cancer Cousin        2 maternal cousins dx in 48s  . Breast cancer Cousin    The patient's father died at the age of 8 from an accident. The patient's mother is living, currently 56 years old. She has a history of breast cancer, postmenopausal. The patient has one brother, no sisters. In addition the patient has a maternal aunt who was diagnosed with breast cancer at age 99 and a cousin who was diagnosed with breast cancer at age 49 (also found to be BRCA positive, followed at [facility]). She says 2 maternal uncles had a history of colon cancer.   GYNECOLOGIC HISTORY: Menarche age 81, first live birth age 26, which of course increases the risk of breast cancer. She is GX P2. 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 She was very active in [employer]. Her husband Amber Santos is V.P. at [employer]. He had surgery for prostate cancer in 2014 Son Amber Santos lives in [place] and son Amber Santos lives in [place]. Both work for [employer]: In place  HEALTH MAINTENANCE: Social History   Tobacco Use  . Smoking status: Former Smoker    Packs/day: 0.50    Years: 12.00    Pack years: 6.00    Last attempt to quit: 06/17/1984    Years since quitting: 34.2  . Smokeless tobacco: Never Used  Substance Use Topics  . Alcohol use: Yes    Alcohol/week: 3.0 standard drinks    Types: 3 Shots of liquor per week    Comment: weekends, events  . Drug use: No     Colonoscopy: 2005?  PAP: 2012  Bone density: [facility]/12/[facility]/ [facility]l  Lipid panel:  Allergies  Allergen Reactions  . Codeine Other (See Comments)    headaches    Current Outpatient Medications  Medication Sig Dispense Refill  . tamoxifen (NOLVADEX) 20 MG tablet Take 1 tablet (20 mg total) by mouth daily. 90 tablet 4  . triamcinolone ointment (KENALOG) 0.5 % Apply 1 application topically 2  (two) times daily. 30 g 0   No current facility-administered medications for this visit.     OBJECTIVE: Middle-aged white woman who appears well Vitals:   09/13/18 1150  BP: 116/77  Pulse: 70  Resp: 18  Temp: (!) 97.5 F (36.4 C)  SpO2: 100%     Body mass index is 30.21 kg/m.    ECOG FS: 0  Sclerae unicteric, pupils round and equal Oropharynx clear and moist No cervical or supraclavicular adenopathy Lungs no rales or rhonchi Heart regular rate and rhythm Abd soft, nontender, positive bowel sounds MSK no focal spinal tenderness, no upper extremity lymphedema Neuro: nonfocal, well oriented, appropriate affect Breasts: Right breast is unremarkable.  The left breast is status post lumpectomy and radiation.  There is no evidence of local recurrence.  Both axillae are benign. Skin: Sun damage as noted previously but no suspicious lesion   LAB RESULTS: Lab Results  Component Value Date   WBC 4.1 09/12/2018   NEUTROABS 2.3 09/12/2018   HGB 11.7 (Amber) 09/12/2018   HCT 36.6  09/12/2018   MCV 83.9 09/12/2018   PLT 207 09/12/2018      Chemistry      Component Value Date/Time   NA 140 09/12/2018 1104   NA 140 06/09/2017 1217      Component Value Date/Time   CALCIUM 9.2 09/12/2018 1104   CALCIUM 9.6 06/09/2017 1217       No results found for: LABCA2  No components found for: LABCA125  No results for input(s): INR in the last 168 hours.  Urinalysis No results found for: COLORURINE  STUDIES: Mammography results discussed with the patient  ASSESSMENT: 67 y.o. BRCA-2 positive [place] woman status post left lumpectomy with no axillary lymph node sampling 06/17/2012 for a pT1c pNX, clinical stage IA invasive ductal carcinoma, grade 2, 100% estrogen and 100% progesterone receptor positive, HER-2 negative, with an MIB-1 of 16%.  (1) left sentinel lymph node biopsy 07/25/2012 showed 0 of 3 lymph nodes positive  (2) Oncotype DX shows a recurrence score of 21, predicting  a distant recurrence risk within 10 years of 14% if the patient's only systemic treatment is tamoxifen for 5 years  (3) adjuvant radiation therapy completed 10/03/2012  (4) tamoxifen started January 2014; the plan is to continue to January 2024  (5) BRCA-2 positive  (a) s/p TAH-BSO 05/01/2013  (b) to have yearly breast MRI in addition to mammography  PLAN: Amber Santos is now 6 years out from definitive surgery for breast cancer with no evidence of disease recurrence.  This is very favorable.  She is tolerating tamoxifen well.  She is interested in continuing to a total of 10 years which will further reduce her risk of breast cancer recurrence by a couple of percentage points and which will also improve her bone density  I encouraged her to follow-up with Dr. Renda Rolls her dermatologist on a once a year basis.  She will see me again in 1 year  She knows to call for any other issue that may develop before the next visit.  Amber Santos, Virgie Dad, MD  09/13/18 12:22 PM Medical Oncology and Hematology [facility] [address] [place], [place] [address] Tel. [phone]    Fax. [phone]   I, Jacqualyn Posey am acting as a Education administrator for Chauncey Cruel, MD.   I, Lurline Del MD, have reviewed the above documentation for accuracy and completeness, and I agree with the above.

## 2018-09-13 ENCOUNTER — Telehealth: Payer: Self-pay | Admitting: Oncology

## 2018-09-13 ENCOUNTER — Inpatient Hospital Stay (HOSPITAL_BASED_OUTPATIENT_CLINIC_OR_DEPARTMENT_OTHER): Payer: Medicare Other | Admitting: Oncology

## 2018-09-13 VITALS — BP 116/77 | HR 70 | Temp 97.5°F | Resp 18 | Ht 67.5 in | Wt 195.8 lb

## 2018-09-13 DIAGNOSIS — Z923 Personal history of irradiation: Secondary | ICD-10-CM

## 2018-09-13 DIAGNOSIS — D0512 Intraductal carcinoma in situ of left breast: Secondary | ICD-10-CM

## 2018-09-13 DIAGNOSIS — Z803 Family history of malignant neoplasm of breast: Secondary | ICD-10-CM

## 2018-09-13 DIAGNOSIS — Z170 Estrogen receptor positive status [ER+]: Secondary | ICD-10-CM | POA: Diagnosis not present

## 2018-09-13 DIAGNOSIS — Z8051 Family history of malignant neoplasm of kidney: Secondary | ICD-10-CM

## 2018-09-13 DIAGNOSIS — Z800 Family history of malignant neoplasm of digestive organs: Secondary | ICD-10-CM

## 2018-09-13 DIAGNOSIS — Z79810 Long term (current) use of selective estrogen receptor modulators (SERMs): Secondary | ICD-10-CM

## 2018-09-13 DIAGNOSIS — Z90710 Acquired absence of both cervix and uterus: Secondary | ICD-10-CM

## 2018-09-13 DIAGNOSIS — Z79899 Other long term (current) drug therapy: Secondary | ICD-10-CM

## 2018-09-13 DIAGNOSIS — C50412 Malignant neoplasm of upper-outer quadrant of left female breast: Secondary | ICD-10-CM

## 2018-09-13 DIAGNOSIS — Z87891 Personal history of nicotine dependence: Secondary | ICD-10-CM | POA: Diagnosis not present

## 2018-09-13 MED ORDER — TAMOXIFEN CITRATE 20 MG PO TABS: 20.0000 mg | ORAL_TABLET | Freq: Every day | ORAL | 4 refills | Status: DC

## 2018-09-13 NOTE — Telephone Encounter (Signed)
Patient decline avs and calendar °

## 2018-11-17 ENCOUNTER — Telehealth: Payer: Self-pay | Admitting: Obstetrics & Gynecology

## 2018-11-17 NOTE — Telephone Encounter (Signed)
Returned call to patient. Review with patient that her last colonoscopy was 09-14-14 with Dr. Olevia Perches. Per review of procedure note coloscopy was normal, with 10 year follow up recommended. Advised patient not due until 2025. Patient verbalized understanding and appreciative of phone call with update.   Routing to provider and will close encounter.

## 2018-11-17 NOTE — Telephone Encounter (Signed)
Patient believes it is time for her to get a colonoscopy. Patient would like to confirm that and get orders in, if it is time.

## 2019-04-14 ENCOUNTER — Ambulatory Visit: Payer: BC Managed Care – PPO | Admitting: Obstetrics & Gynecology

## 2019-04-27 ENCOUNTER — Other Ambulatory Visit: Payer: Self-pay

## 2019-05-01 ENCOUNTER — Ambulatory Visit: Payer: Medicare Other | Admitting: Obstetrics & Gynecology

## 2019-05-01 ENCOUNTER — Encounter: Payer: Self-pay | Admitting: Obstetrics & Gynecology

## 2019-05-01 ENCOUNTER — Other Ambulatory Visit: Payer: Self-pay

## 2019-05-01 VITALS — BP 100/68 | HR 68 | Temp 97.2°F | Ht 67.5 in | Wt 196.0 lb

## 2019-05-01 DIAGNOSIS — D649 Anemia, unspecified: Secondary | ICD-10-CM

## 2019-05-01 DIAGNOSIS — R7989 Other specified abnormal findings of blood chemistry: Secondary | ICD-10-CM

## 2019-05-01 DIAGNOSIS — E7800 Pure hypercholesterolemia, unspecified: Secondary | ICD-10-CM

## 2019-05-01 DIAGNOSIS — E2839 Other primary ovarian failure: Secondary | ICD-10-CM

## 2019-05-01 DIAGNOSIS — Z01419 Encounter for gynecological examination (general) (routine) without abnormal findings: Secondary | ICD-10-CM

## 2019-05-01 DIAGNOSIS — R7309 Other abnormal glucose: Secondary | ICD-10-CM

## 2019-05-01 NOTE — Progress Notes (Addendum)
68 y.o. G2P2 Married White or Caucasian female here for annual exam.  Doing well.  Denies vaginal bleeding.    H/o breast cancer 8/13 with atypical ductal hyperplasia.  H/o BRCA 2 testing with robotic TLH/BSO 7/14.  Currently on Tamoxifen.    Denies vaginal bleeding.    Patient's last menstrual period was 10/05/2004 (approximate).          Sexually active: No.  The current method of family planning is status post hysterectomy.    Exercising: Yes.    pilates, trainer  Smoker:  no  Health Maintenance: Pap:  2013 Normal  History of abnormal Pap:  no MMG:  07/19/18 Diagnostic Bilat  BIRADS2:Benign. F/u 1 year. Colonoscopy:  09/14/14.  Follow up 10 years.   BMD:   2009 Normal  TDaP:  2014 Pneumonia vaccine(s):  Done  Shingrix:   No Hep C testing: has donated blood Screening Labs: Here today     reports that she quit smoking about 34 years ago. She has a 6.00 pack-year smoking history. She has never used smokeless tobacco. She reports current alcohol use of about 1.0 - 2.0 standard drinks of alcohol per week. She reports that she does not use drugs.  Past Medical History:  Diagnosis Date  . Anemia   . Breast cancer ([place]) 2013   high grade ductal ca left breast  . Personal history of radiation therapy 2013   Left Breast Cancer  . Urinary incontinence     Past Surgical History:  Procedure Laterality Date  . ANTERIOR CERVICAL DECOMP/DISCECTOMY FUSION  01/10/2009   C4-7  . AXILLARY LYMPH NODE BIOPSY  07/25/12   left  . BREAST LUMPECTOMY Left 06/17/2012   left partial mastectomy ,radiation  . [place]  . CYSTOSCOPY N/A 05/01/2013   Procedure: CYSTOSCOPY;  Surgeon: Lyman Speller, MD;  Location: [facility];  Service: Gynecology;  Laterality: N/A;  . ROBOTIC ASSISTED TOTAL HYSTERECTOMY WITH BILATERAL SALPINGO OOPHERECTOMY Bilateral 05/01/2013   Procedure: ROBOTIC ASSISTED TOTAL HYSTERECTOMY WITH BILATERAL SALPINGO OOPHORECTOMY;  Surgeon: Lyman Speller, MD;   Location: [facility];  Service: Gynecology;  Laterality: Bilateral;    Current Outpatient Medications  Medication Sig Dispense Refill  . tamoxifen (NOLVADEX) 20 MG tablet Take 1 tablet (20 mg total) by mouth daily. 90 tablet 4   No current facility-administered medications for this visit.     Family History  Problem Relation Age of Onset  . Breast cancer Mother 75  . Cancer Maternal Aunt        breast, pancreatic  . Colon cancer Maternal Uncle   . Breast cancer Maternal Uncle   . Breast cancer Maternal Aunt 28  . Colon cancer Maternal Grandmother   . Kidney cancer Maternal Uncle   . Breast cancer Cousin        2 maternal cousins dx in 3s  . Breast cancer Cousin     Review of Systems  All other systems reviewed and are negative.   Exam:   BP 100/68   Pulse 68   Temp (!) 97.2 F (36.2 C) (Temporal)   Ht 5' 7.5" (1.715 m)   Wt 196 lb (88.9 kg)   LMP 10/05/2004 (Approximate)   BMI 30.24 kg/m    Height: 5' 7.5" (171.5 cm)  Ht Readings from Last 3 Encounters:  05/01/19 5' 7.5" (1.715 m)  09/13/18 5' 7.5" (1.715 m)  01/19/18 5' 7.5" (1.715 m)    General appearance: alert, cooperative and appears stated age Head:  Normocephalic, without obvious abnormality, atraumatic Neck: no adenopathy, supple, symmetrical, trachea midline and thyroid normal to inspection and palpation Lungs: clear to auscultation bilaterally Breasts: normal appearance, no masses or tenderness Heart: regular rate and rhythm Abdomen: soft, non-tender; bowel sounds normal; no masses,  no organomegaly Extremities: extremities normal, atraumatic, no cyanosis or edema Skin: Skin color, texture, turgor normal. No rashes or lesions Lymph nodes: Cervical, supraclavicular, and axillary nodes normal. No abnormal inguinal nodes palpated Neurologic: Grossly normal   Pelvic: External genitalia:  no lesions              Urethra:  normal appearing urethra with no masses, tenderness or lesions               Bartholins and Skenes: normal                 Vagina: normal appearing vagina with normal color and discharge, no lesions              Cervix: absent              Pap taken: No. Bimanual Exam:  Uterus:  uterus absent              Adnexa: no mass, fullness, tenderness               Rectovaginal: Confirms               Anus:  normal sphincter tone, no lesions  Chaperone was present for exam.  A:  Well Woman with normal exam PMP, no HRT H/o invasive ductal breast cancer, ER+/PR+, her 2 nu, s/p lumpectomy and radiation BRCA 2+ H/O TLH/BSO  P:   Mammogram guidelines reviewed.  Yearly MRI discsussed with pt.  Last was 4/19.  Does not want to do one this year.  Willing to have MRI 01/2020.  Reminder placed to call pt and schedule pap smear not indicated BMD ordered to be done with MMG in October.   CBC, HbA1C, Vit D, TSH, lipids Return annually or prn

## 2019-05-01 NOTE — Patient Instructions (Signed)
Outpatient Pharmacy at [facility] [address] [place], [place] [address]  Main: [phone]

## 2019-05-02 LAB — LIPID PANEL
Chol/HDL Ratio: 2.7 ratio (ref 0.0–4.4)
Cholesterol, Total: 187 mg/dL (ref 100–199)
HDL: 69 mg/dL (ref >39)
LDL Calculated: 88 mg/dL (ref 0–99)
Triglycerides: 152 mg/dL — ABNORMAL HIGH (ref 0–149)
VLDL Cholesterol Cal: 30 mg/dL (ref 5–40)

## 2019-05-02 LAB — HEMOGLOBIN A1C
Est. average glucose Bld gHb Est-mCnc: 117 mg/dL
Hgb A1c MFr Bld: 5.7 % — ABNORMAL HIGH (ref 4.8–5.6)

## 2019-05-02 LAB — VITAMIN D 25 HYDROXY (VIT D DEFICIENCY, FRACTURES): Vit D, 25-Hydroxy: 32.1 ng/mL (ref 30.0–100.0)

## 2019-05-02 LAB — TSH: TSH: 9.94 u[IU]/mL — ABNORMAL HIGH (ref 0.450–4.500)

## 2019-05-10 ENCOUNTER — Other Ambulatory Visit: Payer: Self-pay | Admitting: Obstetrics & Gynecology

## 2019-05-10 DIAGNOSIS — Z1231 Encounter for screening mammogram for malignant neoplasm of breast: Secondary | ICD-10-CM

## 2019-05-10 NOTE — Addendum Note (Signed)
Addended by: Megan Salon on: 05/10/2019 03:25 PM   Modules accepted: Orders

## 2019-05-11 ENCOUNTER — Telehealth: Payer: Self-pay | Admitting: *Deleted

## 2019-05-11 NOTE — Telephone Encounter (Signed)
LM for pt to call back.

## 2019-05-11 NOTE — Telephone Encounter (Signed)
Notes recorded by Polly Cobia, CMA on 05/11/2019 at 1:14 PM EDT  Pt notified. Verbalized understanding.  Lab appt scheduled

## 2019-05-11 NOTE — Telephone Encounter (Signed)
-----   Message from Megan Salon, MD sent at 05/10/2019  3:23 PM EDT ----- Please let pt know her cholesterol was fine.  Her Vit D was fine.  Her TSH is elevated which looks like she may have decreased thyroid function.  She needs this repeated with a Free T4 level.  Order has been placed.  Also, her hb A1c is in the early pre-diabetes range at 5.7.  Normal is 5.6.  Watching sugars in diet, exercise and a little weight loss would normalize this.    Please schedule labs in about 2 weeks.

## 2019-05-17 ENCOUNTER — Other Ambulatory Visit: Payer: Self-pay

## 2019-05-22 ENCOUNTER — Other Ambulatory Visit: Payer: Self-pay

## 2019-05-22 ENCOUNTER — Other Ambulatory Visit (INDEPENDENT_AMBULATORY_CARE_PROVIDER_SITE_OTHER): Payer: Medicare Other

## 2019-05-22 DIAGNOSIS — E0590 Thyrotoxicosis, unspecified without thyrotoxic crisis or storm: Secondary | ICD-10-CM

## 2019-05-22 DIAGNOSIS — R7989 Other specified abnormal findings of blood chemistry: Secondary | ICD-10-CM

## 2019-05-23 LAB — T4, FREE: Free T4: 0.77 ng/dL — ABNORMAL LOW (ref 0.82–1.77)

## 2019-05-23 LAB — TSH: TSH: 16.2 u[IU]/mL — ABNORMAL HIGH (ref 0.450–4.500)

## 2019-06-27 ENCOUNTER — Other Ambulatory Visit: Payer: Self-pay

## 2019-06-29 ENCOUNTER — Encounter

## 2019-06-29 ENCOUNTER — Ambulatory Visit: Payer: Medicare Other | Admitting: Internal Medicine

## 2019-06-29 ENCOUNTER — Encounter: Payer: Self-pay | Admitting: Internal Medicine

## 2019-06-29 ENCOUNTER — Other Ambulatory Visit: Payer: Self-pay

## 2019-06-29 VITALS — BP 112/78 | HR 63 | Ht 67.5 in | Wt 196.0 lb

## 2019-06-29 DIAGNOSIS — E041 Nontoxic single thyroid nodule: Secondary | ICD-10-CM

## 2019-06-29 DIAGNOSIS — E039 Hypothyroidism, unspecified: Secondary | ICD-10-CM | POA: Diagnosis not present

## 2019-06-29 LAB — TSH: TSH: 10.63 u[IU]/mL — ABNORMAL HIGH (ref 0.35–4.50)

## 2019-06-29 LAB — T3, FREE: T3, Free: 2.6 pg/mL (ref 2.3–4.2)

## 2019-06-29 LAB — T4, FREE: Free T4: 0.66 ng/dL (ref 0.60–1.60)

## 2019-06-29 NOTE — Progress Notes (Addendum)
Patient ID: Amber Santos, female   DOB: 11-07-1950, 68 y.o.   MRN: [id number]    HPI  Amber Santos is a 68 y.o.-year-old female, referred by her ObGyn Dr., Dr. Sabra Heck, for management of hypothyroidism.  Pt. has been dx with hypothyroidism in 04/2019 >> not on Levothyroxine yet.  I reviewed pt's thyroid tests: Lab Results  Component Value Date   TSH 16.200 (H) 05/22/2019   TSH 9.940 (H) 05/01/2019   FREET4 0.77 (L) 05/22/2019  No results found for: T3FREE   Antithyroid antibodies: No results found for: THGAB No components found for: TPOAB  Pt describes: - + weight gain - 10 lb since few years ago - + fatigue - no cold intolerance - no depression - + constipation - + dry skin - + hair loss  Pt denies feeling nodules in neck, hoarseness, dysphagia/odynophagia, SOB with lying down.  She has + FH of thyroid disorders in: mother and cousin - both hypothyroidism. No FH of thyroid cancer.  No h/o radiation tx to head or neck. No recent use of iodine supplements.  Pt. also has a history of breast cancer - 2013, and takes tamoxifen. She has BrCA 2. She had RxTx in 2013. She had hysterectomy in 2014.   She does have a history of URIs 11/24/2018.  She is a retired Pharmacist, hospital.  ROS: Constitutional: + see HPI Eyes: no blurry vision, no xerophthalmia ENT: no sore throat, no nodules palpated in throat, no dysphagia/odynophagia, no hoarseness, + tinnitus Cardiovascular: no CP/SOB/palpitations/leg swelling Respiratory: no cough/SOB Gastrointestinal: no N/V/D/ + C Musculoskeletal: no muscle/joint aches Skin: no rashes, + hair loss, + dry skin Neurological: no tremors/numbness/tingling/dizziness Psychiatric: no depression/anxiety  Past Medical History:  Diagnosis Date  . Anemia   . Breast cancer ([place]) 2013   high grade ductal ca left breast  . Personal history of radiation therapy 2013   Left Breast Cancer  . Urinary incontinence    Past Surgical History:  Procedure  Laterality Date  . ANTERIOR CERVICAL DECOMP/DISCECTOMY FUSION  01/10/2009   C4-7  . AXILLARY LYMPH NODE BIOPSY  07/25/12   left  . BREAST LUMPECTOMY Left 06/17/2012   left partial mastectomy ,radiation  . [place]  . CYSTOSCOPY N/A 05/01/2013   Procedure: CYSTOSCOPY;  Surgeon: Lyman Speller, MD;  Location: [facility];  Service: Gynecology;  Laterality: N/A;  . ROBOTIC ASSISTED TOTAL HYSTERECTOMY WITH BILATERAL SALPINGO OOPHERECTOMY Bilateral 05/01/2013   Procedure: ROBOTIC ASSISTED TOTAL HYSTERECTOMY WITH BILATERAL SALPINGO OOPHORECTOMY;  Surgeon: Lyman Speller, MD;  Location: [facility];  Service: Gynecology;  Laterality: Bilateral;   Social History   Socioeconomic History  . Marital status: Married    Spouse name: Not on file  . Number of children: 2  . Years of education: Not on file  . Highest education level: Not on file  Occupational History  . Occupation: Associate Professor: [employer]  Social Needs  . Financial resource strain: Not on file  . Food insecurity    Worry: Not on file    Inability: Not on file  . Transportation needs    Medical: Not on file    Non-medical: Not on file  Tobacco Use  . Smoking status: Former Smoker    Packs/day: 0.50    Years: 12.00    Pack years: 6.00    Quit date: 06/17/1984    Years since quitting: 35.0  . Smokeless tobacco: Never Used  Substance and Sexual  Activity  . Alcohol use: Yes    Alcohol/week: 1.0 - 2.0 standard drinks    Types: 1 - 2 Glasses of wine per week  . Drug use: No  . Sexual activity: Not Currently    Birth control/protection: Surgical, Abstinence  Lifestyle  . Physical activity    Days per week: Not on file    Minutes per session: Not on file  . Stress: Not on file  Relationships  . Social Herbalist on phone: Not on file    Gets together: Not on file    Attends religious service: Not on file    Active member of club or organization: Not on file    Attends  meetings of clubs or organizations: Not on file    Relationship status: Not on file  . Intimate partner violence    Fear of current or ex partner: Not on file    Emotionally abused: Not on file    Physically abused: Not on file    Forced sexual activity: Not on file  Other Topics Concern  . Not on file  Social History Narrative  . Not on file   Current Outpatient Medications on File Prior to Visit  Medication Sig Dispense Refill  . tamoxifen (NOLVADEX) 20 MG tablet Take 1 tablet (20 mg total) by mouth daily. 90 tablet 4   No current facility-administered medications on file prior to visit.    Allergies  Allergen Reactions  . Codeine Other (See Comments)    headaches   Family History  Problem Relation Age of Onset  . Breast cancer Mother 54  . Cancer Maternal Aunt        breast, pancreatic  . Colon cancer Maternal Uncle   . Breast cancer Maternal Uncle   . Breast cancer Maternal Aunt 28  . Colon cancer Maternal Grandmother   . Kidney cancer Maternal Uncle   . Breast cancer Cousin        2 maternal cousins dx in 59s  . Breast cancer Cousin     PE: BP 112/78   Pulse 63   Ht 5' 7.5" (1.715 m) Comment: measured  Wt 196 lb (88.9 kg)   LMP 10/05/2004 (Approximate)   SpO2 97%   BMI 30.24 kg/m  Wt Readings from Last 3 Encounters:  06/29/19 196 lb (88.9 kg)  05/01/19 196 lb (88.9 kg)  09/13/18 195 lb 12.8 oz (88.8 kg)   Constitutional: overweight, in NAD Eyes: PERRLA, EOMI, no exophthalmos ENT: moist mucous membranes, + left thyromegaly versus left thyroid nodule, no cervical lymphadenopathy Cardiovascular: RRR, No MRG Respiratory: CTA B Gastrointestinal: abdomen soft, NT, ND, BS+ Musculoskeletal: no deformities, strength intact in all 4 Skin: moist, warm, no rashes Neurological: no tremor with outstretched hands, DTR normal in all 4  ASSESSMENT: 1. Hypothyroidism - new dx  2. Thyroid nodule -L  PLAN:  1. Patient with newly diagnosed hypothyroidism (TSH high  x2, free T4 low), not on levothyroxine therapy. - Upon questioning, she has several possibly hypothyroid symptoms: Weight gain, constipation, hair loss, dry skin - she does not appear to have a goiter, thyroid nodules, or neck compression symptoms - We discussed about her new diagnosis of hypothyroidism and possible causes.  Hashimoto's thyroiditis is an autoimmune disease attacking the thyroid and the most common cause of hypothyroidism in [place].  This can become exacerbated in periods of stress and the patient is more stressed lately with her son's wedding coming up in November.  This will  be held at her house and she does not feel that everything will be ready time. - will check thyroid tests today: TSH, free T4, free T3, and will also add TPO and ATA antibodies to screen for Hashimoto's thyroiditis - We discussed about correct intake of levothyroxine, fasting, with water, separated by at least 30 minutes from breakfast, and separated by more than 4 hours from calcium, iron, multivitamins, acid reflux medications (PPIs).  I will advise her about the dose that we need to start after the results are back. - she will then eat to return in ~6 weeks for repeat labs - Otherwise, I will see her back in 4 months  2. Thyroid nodule -L -On today's physical exam, I feel the left thyroid protruding and it is not clear whether this is a thyroid nodule or not.  -Patient agrees to have a thyroid ultrasound checked and we discussed that if this shows a nodule, depending on the appearance, we may need a biopsy.  I explained what this entails.  Orders Placed This Encounter  Procedures  . US THYROID  . TSH  . T4, free  . T3, free  . Thyroid peroxidase antibody  . Thyroglobulin antibody   Component     Latest Ref Rng & Units 06/29/2019  TSH     0.35 - 4.50 uIU/mL 10.63 (H)  T4,Free(Direct)     0.60 - 1.60 ng/dL 0.66  Triiodothyronine,Free,Serum     2.3 - 4.2 pg/mL 2.6  Thyroperoxidase Ab SerPl-aCnc     <9  IU/mL 241 (H)  Thyroglobulin Ab     < or = 1 IU/mL <1  TPO antibodies are elevated, pointing towards Hashimoto's thyroiditis.  Also, her TSH is high >> will start levothyroxine 50 mg daily and have her back for another set of TFTs in 5 to 6 weeks.  Thyroid U/S: Narrative & Impression    CLINICAL DATA:  Palpable thyroid nodule.  EXAM: THYROID ULTRASOUND  TECHNIQUE: Ultrasound examination of the thyroid gland and adjacent soft tissues was performed.  COMPARISON:  None.  FINDINGS: Parenchymal Echotexture: Moderately heterogenous  Isthmus: 0.7 cm  Right lobe: 5.1 x 2.3 x 1.8 cm  Left lobe: 4.5 x 2.1 x 1.9 cm  _________________________________________________________  Estimated total number of nodules >/= 1 cm: 0  Number of spongiform nodules >/=  2 cm not described below (TR1): 0  Number of mixed cystic and solid nodules >/= 1.5 cm not described below (TR2): 0  _________________________________________________________  No discrete nodules are seen within the thyroid gland.  IMPRESSION: Borderline enlarged heterogeneous thyroid gland without evidence for distinct thyroid nodule.  The above is in keeping with the ACR TI-RADS recommendations - J Am Coll Radiol 2017;14:587-595.   Electronically Signed   By: Constance Holster M.D.   On: 07/11/2019 10:04     Philemon Kingdom, MD PhD [facility] Endocrinology

## 2019-06-29 NOTE — Patient Instructions (Signed)
Please stop at the lab.  Will most likely need to start levothyroxine after the results are back.  I will advise you about the dose then.  Please take the thyroid hormone every day, with water, at least 30 minutes before breakfast, separated by at least 4 hours from: - acid reflux medications - calcium - iron - multivitamins  Please return for another set of labs 5 to 6 weeks after starting levothyroxine.  Please return in 4 months.    Hypothyroidism  Hypothyroidism is when the thyroid gland does not make enough of certain hormones (it is underactive). The thyroid gland is a small gland located in the lower front part of the neck, just in front of the windpipe (trachea). This gland makes hormones that help control how the body uses food for energy (metabolism) as well as how the heart and brain function. These hormones also play a role in keeping your bones strong. When the thyroid is underactive, it produces too little of the hormones thyroxine (T4) and triiodothyronine (T3). What are the causes? This condition may be caused by:  Hashimoto's disease. This is a disease in which the body's disease-fighting system (immune system) attacks the thyroid gland. This is the most common cause.  Viral infections.  Pregnancy.  Certain medicines.  Birth defects.  Past radiation treatments to the head or neck for cancer.  Past treatment with radioactive iodine.  Past exposure to radiation in the environment.  Past surgical removal of part or all of the thyroid.  Problems with a gland in the center of the brain (pituitary gland).  Lack of enough iodine in the diet. What increases the risk? You are more likely to develop this condition if:  You are female.  You have a family history of thyroid conditions.  You use a medicine called lithium.  You take medicines that affect the immune system (immunosuppressants). What are the signs or symptoms? Symptoms of this condition include:   Feeling as though you have no energy (lethargy).  Not being able to tolerate cold.  Weight gain that is not explained by a change in diet or exercise habits.  Lack of appetite.  Dry skin.  Coarse hair.  Menstrual irregularity.  Slowing of thought processes.  Constipation.  Sadness or depression. How is this diagnosed? This condition may be diagnosed based on:  Your symptoms, your medical history, and a physical exam.  Blood tests. You may also have imaging tests, such as an ultrasound or MRI. How is this treated? This condition is treated with medicine that replaces the thyroid hormones that your body does not make. After you begin treatment, it may take several weeks for symptoms to go away. Follow these instructions at home:  Take over-the-counter and prescription medicines only as told by your health care provider.  If you start taking any new medicines, tell your health care provider.  Keep all follow-up visits as told by your health care provider. This is important. ? As your condition improves, your dosage of thyroid hormone medicine may change. ? You will need to have blood tests regularly so that your health care provider can monitor your condition. Contact a health care provider if:  Your symptoms do not get better with treatment.  You are taking thyroid replacement medicine and you: ? Sweat a lot. ? Have tremors. ? Feel anxious. ? Lose weight rapidly. ? Cannot tolerate heat. ? Have emotional swings. ? Have diarrhea. ? Feel weak. Get help right away if you have:  Chest pain.  An irregular heartbeat.  A rapid heartbeat.  Difficulty breathing. Summary  Hypothyroidism is when the thyroid gland does not make enough of certain hormones (it is underactive).  When the thyroid is underactive, it produces too little of the hormones thyroxine (T4) and triiodothyronine (T3).  The most common cause is Hashimoto's disease, a disease in which the body's  disease-fighting system (immune system) attacks the thyroid gland. The condition can also be caused by viral infections, medicine, pregnancy, or past radiation treatment to the head or neck.  Symptoms may include weight gain, dry skin, constipation, feeling as though you do not have energy, and not being able to tolerate cold.  This condition is treated with medicine to replace the thyroid hormones that your body does not make. This information is not intended to replace advice given to you by your health care provider. Make sure you discuss any questions you have with your health care provider. Document Released: 09/21/2005 Document Revised: 09/03/2017 Document Reviewed: 09/01/2017 Elsevier Patient Education  2020 [employer].

## 2019-06-30 LAB — THYROGLOBULIN ANTIBODY: Thyroglobulin Ab: <1 IU/mL (ref <=1)

## 2019-06-30 LAB — THYROID PEROXIDASE ANTIBODY: Thyroperoxidase Ab SerPl-aCnc: 241 IU/mL — ABNORMAL HIGH (ref <9)

## 2019-06-30 MED ORDER — LEVOTHYROXINE SODIUM 50 MCG PO TABS: 50.0000 ug | ORAL_TABLET | Freq: Every day | ORAL | 3 refills | Status: DC

## 2019-07-03 ENCOUNTER — Other Ambulatory Visit: Payer: Self-pay

## 2019-07-03 DIAGNOSIS — Z20822 Contact with and (suspected) exposure to covid-19: Secondary | ICD-10-CM

## 2019-07-04 LAB — NOVEL CORONAVIRUS, NAA: SARS-CoV-2, NAA: NOT DETECTED

## 2019-07-10 ENCOUNTER — Ambulatory Visit
Admission: RE | Admit: 2019-07-10 | Discharge: 2019-07-10 | Disposition: A | Discharge status: Home / Self Care | Payer: Medicare Other | Source: Ambulatory Visit | Attending: Internal Medicine | Admitting: Internal Medicine

## 2019-07-10 DIAGNOSIS — E041 Nontoxic single thyroid nodule: Secondary | ICD-10-CM

## 2019-07-21 ENCOUNTER — Ambulatory Visit
Admission: RE | Admit: 2019-07-21 | Discharge: 2019-07-21 | Disposition: A | Discharge status: Home / Self Care | Payer: Medicare Other | Source: Ambulatory Visit | Attending: Obstetrics & Gynecology | Admitting: Obstetrics & Gynecology

## 2019-07-21 ENCOUNTER — Other Ambulatory Visit: Payer: Self-pay

## 2019-07-21 DIAGNOSIS — Z1231 Encounter for screening mammogram for malignant neoplasm of breast: Secondary | ICD-10-CM

## 2019-07-21 DIAGNOSIS — E2839 Other primary ovarian failure: Secondary | ICD-10-CM

## 2019-08-17 ENCOUNTER — Other Ambulatory Visit: Payer: Medicare Other

## 2019-08-18 ENCOUNTER — Other Ambulatory Visit: Payer: Self-pay

## 2019-08-18 ENCOUNTER — Other Ambulatory Visit (INDEPENDENT_AMBULATORY_CARE_PROVIDER_SITE_OTHER): Payer: Medicare Other

## 2019-08-18 ENCOUNTER — Other Ambulatory Visit: Payer: Self-pay | Admitting: Internal Medicine

## 2019-08-18 DIAGNOSIS — E039 Hypothyroidism, unspecified: Secondary | ICD-10-CM

## 2019-08-18 LAB — T4, FREE: Free T4: 0.74 ng/dL (ref 0.60–1.60)

## 2019-08-18 LAB — TSH: TSH: 5.95 u[IU]/mL — ABNORMAL HIGH (ref 0.35–4.50)

## 2019-08-18 MED ORDER — LEVOTHYROXINE SODIUM 75 MCG PO TABS: 75.0000 ug | ORAL_TABLET | Freq: Every day | ORAL | 3 refills | Status: DC

## 2019-09-05 ENCOUNTER — Telehealth: Payer: Self-pay | Admitting: *Deleted

## 2019-09-05 NOTE — Telephone Encounter (Signed)
Received call from patient needing to change her appointment with Dr. Jana Hakim.  Confirmed new appointment for 12/21/ at 3 for labs and 330 with Dr. Jana Hakim

## 2019-09-14 ENCOUNTER — Other Ambulatory Visit: Payer: Medicare Other

## 2019-09-14 ENCOUNTER — Ambulatory Visit: Payer: Medicare Other | Admitting: Oncology

## 2019-09-22 ENCOUNTER — Other Ambulatory Visit: Payer: Self-pay

## 2019-09-22 DIAGNOSIS — C50412 Malignant neoplasm of upper-outer quadrant of left female breast: Secondary | ICD-10-CM

## 2019-09-22 DIAGNOSIS — Z170 Estrogen receptor positive status [ER+]: Secondary | ICD-10-CM

## 2019-09-24 NOTE — Progress Notes (Signed)
ID: RAYME BUI   DOB: 1951-08-24  MR#: [id number]  YOV#:[id number]  Patient Care Team: Vicenta Aly, FNP as PCP - General (Nurse Practitioner) Magrinat, Virgie Dad, MD as Consulting Physician (Oncology) Megan Salon, MD as Consulting Physician (Gynecology) OTHER MD:   CHIEF COMPLAINT: BRCA2 positive breast cancer  CURRENT TREATMENT: Tamoxifen; intensified screening.   INTERVAL HISTORY: Marily returns today for follow-up of her estrogen receptor positive breast cancer.   She continues on tamoxifen.  She is doing just fine in terms of hot flashes and vaginal wetness, neither of which are major issues for her.  Since her last visit, she underwent bilateral screening mammography with tomography at [facility] on 07/21/2019 showing: breast density category B; no evidence of malignancy in either breast.  She also underwent bone density screening the same day. This showed a T-score of -1.6, which is considered osteopenic.  She presented with a palpable thyroid nodule and underwent thyroid ultrasound on 07/10/2019. This showed: borderline enlarged heterogeneous thyroid gland without evidence for distinct thyroid nodule.  She is now on Synthroid.  She was supposed to have had an MRI in the spring 2019, but with the pandemic chaos that did not occur  REVIEW OF SYSTEMS: Octivia pretty much stopped alcohol a year ago.  She has lost 10 pounds.  She also started working out with a trainer once a week.  She absolutely loves it.  She just feels fitter and better.  She has 1 grandchild and a second in the way.  [place]id cases resulting.  Her husband is now retired.  Aside from these issues a detailed review of systems today was entirely benign.   HISTORY OF BREAST CANCER: From the earlier summary note:  Aliviah had routine screening mammography at [facility] 05/23/2022 showing new calcifications in the left breast.  Diagnostic left mammography 05/27/2012 confirmed a cluster of faint calcifications in the upper outer quadrant. Biopsy was performed 06/01/2012, and showed atypical ductal hyperplasia ([id number]). Accordingly the patient underwent left lumpectomy 06/17/2012. This showed ([id number]) invasive ductal carcinoma, grade 2, measuring 1.4 cm, with associated high-grade ductal carcinoma in situ. There were 2 additional areas of ductal carcinoma in situ, but margins were clear. The invasive tumor was 100% estrogen receptor and 100% progesterone receptor positive, with an MIB-1 of 20%. It was HER-2 negative.   On 06/26/2012 the patient underwent bilateral breast MRI. This found a seroma in the upper outer quadrant of the left breast measuring 6 cm. There was no other area of suspicious enhancement in the left breast, no findings of concern in the right breast, and no axillary or internal mammary adenopathy.   The patient's subsequent history is as detailed below   PAST MEDICAL HISTORY: Past Medical History:  Diagnosis Date  . Anemia   . Breast cancer ([place]) 2013   high grade ductal ca left breast  . Personal history of radiation therapy 2013   Left Breast Cancer  . Urinary incontinence     PAST SURGICAL HISTORY: Past Surgical History:  Procedure Laterality Date  . ANTERIOR CERVICAL DECOMP/DISCECTOMY FUSION  01/10/2009   C4-7  . AXILLARY LYMPH NODE BIOPSY  07/25/12   left  . BREAST LUMPECTOMY Left 06/17/2012   left partial mastectomy ,radiation  . [place]  . CYSTOSCOPY N/A 05/01/2013   Procedure: CYSTOSCOPY;  Surgeon: Lyman Speller, MD;  Location: [facility];  Service: Gynecology;  Laterality: N/A;  . ROBOTIC ASSISTED TOTAL HYSTERECTOMY WITH BILATERAL SALPINGO OOPHERECTOMY Bilateral 05/01/2013   Procedure: ROBOTIC ASSISTED TOTAL HYSTERECTOMY WITH BILATERAL SALPINGO OOPHORECTOMY;  Surgeon: Lyman Speller, MD;  Location: [facility];  Service: Gynecology;  Laterality:  Bilateral;    FAMILY HISTORY Family History  Problem Relation Age of Onset  . Breast cancer Mother 34  . Cancer Maternal Aunt        breast, pancreatic  . Colon cancer Maternal Uncle   . Breast cancer Maternal Uncle   . Breast cancer Maternal Aunt 28  . Colon cancer Maternal Grandmother   . Kidney cancer Maternal Uncle   . Breast cancer Cousin        2 maternal cousins dx in 40s  . Breast cancer Cousin    The patient's father died at the age of 27 from an accident. The patient's mother is living, currently 20 years old. She has a history of breast cancer, postmenopausal. The patient has one brother, no sisters. In addition the patient has a maternal aunt who was diagnosed with breast cancer at age 5 and a cousin who was diagnosed with breast cancer at age 14 (also found to be BRCA positive, followed at [facility]). She says 2 maternal uncles had a history of colon cancer.   GYNECOLOGIC HISTORY: Menarche age 45, first live birth age 66, which of course increases the risk of breast cancer. She is GX P2. 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 She was very active in [employer]. Her husband [name] is V.P. at [employer]. He had surgery for prostate cancer in 2014 Son [name] lives in [place] and will have his second child in 2021 and son [name] lives in [place] and married in 2020. Both work for [employer]: In place   HEALTH MAINTENANCE: Social History   Tobacco Use  . Smoking status: Former Smoker    Packs/day: 0.50    Years: 12.00    Pack years: 6.00    Quit date: 06/17/1984    Years since quitting: 35.2  . Smokeless tobacco: Never Used  Substance Use Topics  . Alcohol use: Yes    Alcohol/week: 1.0 - 2.0 standard drinks    Types: 1 - 2 Glasses of wine per week  . Drug use: No     Colonoscopy: 2005?  PAP: 2012  Bone density:  [facility]/12/[facility]l  Lipid panel:  Allergies  Allergen Reactions  . Codeine Other (See Comments)    headaches    Current Outpatient Medications  Medication Sig Dispense Refill  . levothyroxine (SYNTHROID) 75 MCG tablet Take 1 tablet (75 mcg total) by mouth daily. 45 tablet 3  . tamoxifen (NOLVADEX) 20 MG tablet Take 1 tablet (20 mg total) by mouth daily. 90 tablet 4   No current facility-administered medications for this visit.    OBJECTIVE: Middle-aged white woman who appears well Vitals:   09/25/19 1535  BP: 132/68  Pulse: 82  Resp: 18  Temp: 97.9 F (36.6 C)  SpO2: 100%     Body mass index is 29.98 kg/m.    ECOG FS: 1  Sclerae unicteric, EOMs intact Wearing a mask No cervical or supraclavicular adenopathy Lungs no rales or rhonchi Heart regular rate and rhythm Abd soft, nontender, positive bowel sounds MSK no focal spinal tenderness, no upper extremity lymphedema Neuro: nonfocal, well oriented, appropriate affect Breasts:  The right breast is benign.  The left breast has undergone lumpectomy and radiation.  There is no evidence of disease recurrence.  Both axillae are benign.   LAB RESULTS: Lab Results  Component Value Date   WBC 4.2 09/25/2019   NEUTROABS 2.0 09/25/2019   HGB 11.6 (L) 09/25/2019   HCT 36.0 09/25/2019   MCV 84.1 09/25/2019   PLT 217 09/25/2019      Chemistry      Component Value Date/Time   NA 140 09/25/2019 1456   NA 140 06/09/2017 1217      Component Value Date/Time   CALCIUM 8.9 09/25/2019 1456   CALCIUM 9.6 06/09/2017 1217       No results found for: LABCA2  No components found for: [id number]  No results for input(s): INR in the last 168 hours.   Urinalysis No results found for: COLORURINE   STUDIES: No results found.   ASSESSMENT: 68 y.o. BRCA-2 positive [place] woman status post left lumpectomy with no axillary lymph node sampling 06/17/2012 for a pT1c pNX, clinical stage IA invasive ductal carcinoma,  grade 2, 100% estrogen and 100% progesterone receptor positive, HER-2 negative, with an MIB-1 of 16%.  (1) left sentinel lymph node biopsy 07/25/2012 showed 0 of 3 lymph nodes positive  (2) Oncotype DX shows a recurrence score of 21, predicting a distant recurrence risk within 10 years of 14% if the patient's only systemic treatment is tamoxifen for 5 years  (3) adjuvant radiation therapy completed 10/03/2012  (4) tamoxifen started January 2014; the plan is to continue to January 2024  (a) bone density 07/21/2019 shows a T score of -1.6.  (5) BRCA-2 positive  (a) s/p TAH-BSO 05/01/2013  (b) intensified screening  PLAN: Aidan is now a little more than 6 years out from definitive surgery for her breast cancer with no evidence of disease recurrence.  This is very favorable.  She is tolerating tamoxifen well and the plan is to continue that a minimum of 10 years.  We did not do an MRI this year because of the Covid pandemic.  However her breast density is now category B.  She has been having to pay $2500 for each MRI.  We discussed this at length.  I am not uncomfortable obtaining a breast MRI every other or even every 3 years.  Basically if she meets her deductible in a particular year we will consider getting an MRI of the breast that year.  Otherwise she will have mammography again October 2021 and see me again next November  She knows to call for any other issue that may develop before then.   Jaykub Mackins, Virgie Dad, MD  09/25/19 3:39 PM Medical Oncology and Hematology [facility] [place], [place] [address] Tel. [phone]    Fax. [phone]   I, Wilburn Mylar, am acting as scribe for Dr. Virgie Dad. Lemmie Vanlanen.  I, Lurline Del MD, have reviewed the above documentation for accuracy and completeness, and I agree with the above.

## 2019-09-25 ENCOUNTER — Inpatient Hospital Stay (HOSPITAL_BASED_OUTPATIENT_CLINIC_OR_DEPARTMENT_OTHER): Payer: Medicare Other | Admitting: Oncology

## 2019-09-25 ENCOUNTER — Inpatient Hospital Stay: Payer: Medicare Other | Attending: Oncology

## 2019-09-25 ENCOUNTER — Other Ambulatory Visit: Payer: Self-pay

## 2019-09-25 VITALS — BP 132/68 | HR 82 | Temp 97.9°F | Resp 18 | Ht 67.5 in | Wt 194.3 lb

## 2019-09-25 DIAGNOSIS — Z79899 Other long term (current) drug therapy: Secondary | ICD-10-CM | POA: Diagnosis not present

## 2019-09-25 DIAGNOSIS — C50412 Malignant neoplasm of upper-outer quadrant of left female breast: Secondary | ICD-10-CM | POA: Diagnosis not present

## 2019-09-25 DIAGNOSIS — M8580 Other specified disorders of bone density and structure, unspecified site: Secondary | ICD-10-CM | POA: Insufficient documentation

## 2019-09-25 DIAGNOSIS — Z79810 Long term (current) use of selective estrogen receptor modulators (SERMs): Secondary | ICD-10-CM | POA: Insufficient documentation

## 2019-09-25 DIAGNOSIS — Z1509 Genetic susceptibility to other malignant neoplasm: Secondary | ICD-10-CM | POA: Diagnosis not present

## 2019-09-25 DIAGNOSIS — Z1501 Genetic susceptibility to malignant neoplasm of breast: Secondary | ICD-10-CM | POA: Diagnosis not present

## 2019-09-25 DIAGNOSIS — Z170 Estrogen receptor positive status [ER+]: Secondary | ICD-10-CM | POA: Insufficient documentation

## 2019-09-25 DIAGNOSIS — D0512 Intraductal carcinoma in situ of left breast: Secondary | ICD-10-CM | POA: Diagnosis not present

## 2019-09-25 DIAGNOSIS — Z90710 Acquired absence of both cervix and uterus: Secondary | ICD-10-CM | POA: Diagnosis not present

## 2019-09-25 DIAGNOSIS — Z923 Personal history of irradiation: Secondary | ICD-10-CM | POA: Diagnosis not present

## 2019-09-25 DIAGNOSIS — Z87891 Personal history of nicotine dependence: Secondary | ICD-10-CM | POA: Diagnosis not present

## 2019-09-25 LAB — CMP (CANCER CENTER ONLY)
ALT: 19 U/L (ref 0–44)
AST: 26 U/L (ref 15–41)
Albumin: 3.8 g/dL (ref 3.5–5.0)
Alkaline Phosphatase: 43 U/L (ref 38–126)
Anion gap: 8 (ref 5–15)
BUN: 20 mg/dL (ref 8–23)
CO2: 26 mmol/L (ref 22–32)
Calcium: 8.9 mg/dL (ref 8.9–10.3)
Chloride: 106 mmol/L (ref 98–111)
Creatinine: 0.82 mg/dL (ref 0.44–1.00)
GFR, Est AFR Am: >60 mL/min (ref >60)
GFR, Estimated: >60 mL/min (ref >60)
Glucose, Bld: 90 mg/dL (ref 70–99)
Potassium: 3.8 mmol/L (ref 3.5–5.1)
Sodium: 140 mmol/L (ref 135–145)
Total Bilirubin: 0.5 mg/dL (ref 0.3–1.2)
Total Protein: 6.8 g/dL (ref 6.5–8.1)

## 2019-09-25 LAB — CBC WITH DIFFERENTIAL (CANCER CENTER ONLY)
Abs Immature Granulocytes: 0.01 10*3/uL (ref 0.00–0.07)
Basophils Absolute: 0 10*3/uL (ref 0.0–0.1)
Basophils Relative: 1 %
Eosinophils Absolute: 0.1 10*3/uL (ref 0.0–0.5)
Eosinophils Relative: 1 %
HCT: 36 % (ref 36.0–46.0)
Hemoglobin: 11.6 g/dL — ABNORMAL LOW (ref 12.0–15.0)
Immature Granulocytes: 0 %
Lymphocytes Relative: 36 %
Lymphs Abs: 1.5 10*3/uL (ref 0.7–4.0)
MCH: 27.1 pg (ref 26.0–34.0)
MCHC: 32.2 g/dL (ref 30.0–36.0)
MCV: 84.1 fL (ref 80.0–100.0)
Monocytes Absolute: 0.6 10*3/uL (ref 0.1–1.0)
Monocytes Relative: 14 %
Neutro Abs: 2 10*3/uL (ref 1.7–7.7)
Neutrophils Relative %: 48 %
Platelet Count: 217 10*3/uL (ref 150–400)
RBC: 4.28 MIL/uL (ref 3.87–5.11)
RDW: 13.1 % (ref 11.5–15.5)
WBC Count: 4.2 10*3/uL (ref 4.0–10.5)
nRBC: 0 % (ref 0.0–0.2)

## 2019-09-26 ENCOUNTER — Telehealth: Payer: Self-pay | Admitting: Oncology

## 2019-09-26 NOTE — Telephone Encounter (Signed)
I left a message regarding schedule  

## 2019-10-11 ENCOUNTER — Other Ambulatory Visit: Payer: Self-pay | Admitting: Oncology

## 2019-11-05 ENCOUNTER — Ambulatory Visit: Payer: Medicare Other

## 2019-11-07 ENCOUNTER — Other Ambulatory Visit: Payer: Self-pay

## 2019-11-09 ENCOUNTER — Encounter: Payer: Self-pay | Admitting: Internal Medicine

## 2019-11-09 ENCOUNTER — Other Ambulatory Visit: Payer: Self-pay

## 2019-11-09 ENCOUNTER — Ambulatory Visit (INDEPENDENT_AMBULATORY_CARE_PROVIDER_SITE_OTHER): Payer: Medicare HMO | Admitting: Internal Medicine

## 2019-11-09 VITALS — BP 110/70 | HR 70 | Ht 67.5 in | Wt 193.0 lb

## 2019-11-09 DIAGNOSIS — E038 Other specified hypothyroidism: Secondary | ICD-10-CM

## 2019-11-09 DIAGNOSIS — E0789 Other specified disorders of thyroid: Secondary | ICD-10-CM

## 2019-11-09 DIAGNOSIS — E063 Autoimmune thyroiditis: Secondary | ICD-10-CM

## 2019-11-09 LAB — T4, FREE: Free T4: 0.8 ng/dL (ref 0.60–1.60)

## 2019-11-09 LAB — TSH: TSH: 4.48 u[IU]/mL (ref 0.35–4.50)

## 2019-11-09 NOTE — Patient Instructions (Addendum)

## 2019-11-09 NOTE — Progress Notes (Signed)
Patient ID: Amber Santos, female   DOB: 04-14-1951, 69 y.o.   MRN: [id number]   This visit occurred during the SARS-CoV-2 public health emergency.  Safety protocols were in place, including screening questions prior to the visit, additional usage of staff PPE, and extensive cleaning of exam room while observing appropriate contact time as indicated for disinfecting solutions.   HPI  Amber Santos is a 69 y.o.-year-old female, referred by her ObGyn Dr., Dr. Sabra Heck, returning for follow-up for hypothyroidism.  At last visit, we determined that her hypothyroidism is due to Hashimoto's thyroiditis.  She was diagnosed with hypothyroidism in 04/2019.  She was referred to endocrinology and states her TSH remained elevated and she was symptomatic, we started levothyroxine at 50 mcg daily initially and increased to 75 mcg daily in 08/2019.  Pt is on levothyroxine 75 mcg daily, taken: - in am - fasting - + coffee with creamer - at least 30 min from b'fast - no Ca, Fe, MVI, PPIs - not on Biotin  Review TFTs: Lab Results  Component Value Date   TSH 5.95 (H) 08/18/2019   TSH 10.63 (H) 06/29/2019   TSH 16.200 (H) 05/22/2019   TSH 9.940 (H) 05/01/2019   FREET4 0.74 08/18/2019   FREET4 0.66 06/29/2019   FREET4 0.77 (L) 05/22/2019   T3FREE 2.6 06/29/2019   Lab Results  Component Value Date   T3FREE 2.6 06/29/2019    Antithyroid antibodies were positive at last visit: Component     Latest Ref Rng & Units 06/29/2019  Thyroperoxidase Ab SerPl-aCnc     <9 IU/mL 241 (H)  Thyroglobulin Ab     < or = 1 IU/mL <1   At last visit, she described weight gain, constipation, dry skin, hair loss.  These are slightly better after starting levothyroxine.  Pt denies: - feeling nodules in neck - hoarseness - dysphagia - choking - SOB with lying down  She did have a palpable node at last visit but the thyroid ultrasound did not show any nodules Thyroid U/S (07/11/2019): Parenchymal Echotexture:  Moderately heterogenous Isthmus: 0.7 cm Right lobe: 5.1 x 2.3 x 1.8 cm Left lobe: 4.5 x 2.1 x 1.9 cm  No discrete nodules are seen within the thyroid gland.  IMPRESSION: Borderline enlarged heterogeneous thyroid gland without evidence for distinct thyroid nodule.  She has + FH of thyroid disorders in: mother and cousin -both hypothyroidism. No FH of thyroid cancer. No h/o radiation tx to head or neck.  No herbal supplements. No Biotin use. No recent steroids use.   Pt. also has a history of breast cancer - 2013, and takes tamoxifen. She has BrCA 2. She had RxTx in 2013. She had hysterectomy in 2014.   She does have a history of URIs 11/24/2018.  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  ROS: Constitutional: no weight gain/no weight loss, no fatigue, no subjective hyperthermia, no subjective hypothermia Eyes: no blurry vision, no xerophthalmia ENT: no sore throat, + see HPI Cardiovascular: no CP/no SOB/no palpitations/no leg swelling Respiratory: no cough/no SOB/no wheezing Gastrointestinal: no N/no V/no D/no C/no acid reflux Musculoskeletal: no muscle aches/no joint aches Skin: no rashes, + hair loss, + dry skin Neurological: no tremors/no numbness/no tingling/no dizziness  I reviewed pt's medications, allergies, PMH, social hx, family hx, and changes were documented in the history of present illness. Otherwise, unchanged from my initial visit note.  Past Medical  History:  Diagnosis Date  . Anemia   . Breast cancer ([place]) 2013   high grade ductal ca left breast  . Personal history of radiation therapy 2013   Left Breast Cancer  . Urinary incontinence    Past Surgical History:  Procedure Laterality Date  . ANTERIOR CERVICAL DECOMP/DISCECTOMY FUSION  01/10/2009   C4-7  . AXILLARY LYMPH NODE BIOPSY  07/25/12   left  . BREAST LUMPECTOMY Left 06/17/2012   left partial mastectomy ,radiation  . [place]  . CYSTOSCOPY N/A 05/01/2013   Procedure: CYSTOSCOPY;  Surgeon: Lyman Speller, MD;  Location: [facility];  Service: Gynecology;  Laterality: N/A;  . ROBOTIC ASSISTED TOTAL HYSTERECTOMY WITH BILATERAL SALPINGO OOPHERECTOMY Bilateral 05/01/2013   Procedure: ROBOTIC ASSISTED TOTAL HYSTERECTOMY WITH BILATERAL SALPINGO OOPHORECTOMY;  Surgeon: Lyman Speller, MD;  Location: [facility];  Service: Gynecology;  Laterality: Bilateral;   Social History   Socioeconomic History  . Marital status: Married    Spouse name: Not on file  . Number of children: 2  . Years of education: Not on file  . Highest education level: Not on file  Occupational History  . Occupation: Associate Professor: [employer]  Tobacco Use  . Smoking status: Former Smoker    Packs/day: 0.50    Years: 12.00    Pack years: 6.00    Quit date: 06/17/1984    Years since quitting: 35.4  . Smokeless tobacco: Never Used  Substance and Sexual Activity  . Alcohol use: Yes    Alcohol/week: 1.0 - 2.0 standard drinks    Types: 1 - 2 Glasses of wine per week  . Drug use: No  . Sexual activity: Not Currently    Birth control/protection: Surgical, Abstinence  Other Topics Concern  . Not on file  Social History Narrative  . Not on file   Social Determinants of Health   Financial Resource Strain:   . Difficulty of Paying Living Expenses: Not on file  Food Insecurity:   . Worried About Charity fundraiser in the Last Year: Not on file  . Ran Out of Food in the Last Year: Not on file  Transportation Needs:   . Lack of Transportation (Medical): Not on file  . Lack of Transportation (Non-Medical): Not on file  Physical Activity:   . Days of Exercise per Week: Not on file  . Minutes of Exercise per Session: Not on file  Stress:   . Feeling of Stress : Not on file  Social Connections:   . Frequency of Communication with Friends and Family: Not on file  . Frequency of Social Gatherings with  Friends and Family: Not on file  . Attends Religious Services: Not on file  . Active Member of Clubs or Organizations: Not on file  . Attends Archivist Meetings: Not on file  . Marital Status: Not on file  Intimate Partner Violence:   . Fear of Current or Ex-Partner: Not on file  . Emotionally Abused: Not on file  . Physically Abused: Not on file  . Sexually Abused: Not on file   Current Outpatient Medications on File Prior to Visit  Medication Sig Dispense Refill  . levothyroxine (SYNTHROID) 75 MCG tablet Take 1 tablet (75 mcg total) by mouth daily. 45 tablet 3  . tamoxifen (NOLVADEX) 20 MG tablet TAKE 1 TABLET BY MOUTH EVERY DAY 90 tablet 4   No current facility-administered medications on file prior to visit.  Allergies  Allergen Reactions  . Codeine Other (See Comments)    headaches   Family History  Problem Relation Age of Onset  . Breast cancer Mother 52  . Cancer Maternal Aunt        breast, pancreatic  . Colon cancer Maternal Uncle   . Breast cancer Maternal Uncle   . Breast cancer Maternal Aunt 28  . Colon cancer Maternal Grandmother   . Kidney cancer Maternal Uncle   . Breast cancer Cousin        2 maternal cousins dx in 38s  . Breast cancer Cousin     PE: BP 110/70   Pulse 70   Ht 5' 7.5" (1.715 m)   Wt 193 lb (87.5 kg)   LMP 10/05/2004 (Approximate)   SpO2 96%   BMI 29.78 kg/m  Wt Readings from Last 3 Encounters:  11/09/19 193 lb (87.5 kg)  09/25/19 194 lb 4.8 oz (88.1 kg)  06/29/19 196 lb (88.9 kg)   Constitutional: overweight, in NAD Eyes: PERRLA, EOMI, no exophthalmos ENT: moist mucous membranes, + palpable left thyroid lobe, no cervical lymphadenopathy Cardiovascular: RRR, No MRG Respiratory: CTA B Gastrointestinal: abdomen soft, NT, ND, BS+ Musculoskeletal: no deformities, strength intact in all 4 Skin: moist, warm, no rashes Neurological: no tremor with outstretched hands, DTR normal in all 4  ASSESSMENT: 1. Hypothyroidism  2/2 Hashimoto's thyroiditis  2. Left thyroid prominence  PLAN:  1. Patient with recent diagnosis of Hashimoto's hypothyroidism, recently started on levothyroxine.  At last visit her TSH returned high and she also had weight gain, constipation, hair loss, dry skin.  We started 50 mcg levothyroxine and increased the dose in 08/2019 to 75 mcg daily.  It is possible that her Hashimoto's thyroiditis exacerbated due to stress before her son's wedding in November. - latest thyroid labs reviewed with pt >> slightly elevated, after which we increased her levothyroxine dose: Lab Results  Component Value Date   TSH 5.95 (H) 08/18/2019  - pt feels good on this dose. - we discussed about taking the thyroid hormone every day, with water, >30 minutes before breakfast, separated by >4 hours from acid reflux medications, calcium, iron, multivitamins. Pt. is taking it correctly. - will check thyroid tests today: TSH and fT4 - If labs are abnormal, she will need to return for repeat TFTs in 1.5 months  2.  Left thyroid prominence -At last visit, on physical exam, I the left side of her thyroid gland is possibly enlarged.  We did check a thyroid ultrasound and this did not show nodules, so her thyroid is probably slightly rotated. -She denies neck compression symptoms  Component     Latest Ref Rng & Units 11/09/2019  TSH     0.35 - 4.50 uIU/mL 4.48  T4,Free(Direct)     0.60 - 1.60 ng/dL 0.80   Thyroid tests are normal.  For now, we will continue the same dose of levothyroxine and will try to separate coffee and creamer a little bit more from levothyroxine.  Philemon Kingdom, MD PhD [facility] End[facility]

## 2019-11-12 ENCOUNTER — Ambulatory Visit: Payer: Medicare HMO | Attending: Internal Medicine

## 2019-11-12 DIAGNOSIS — Z23 Encounter for immunization: Secondary | ICD-10-CM

## 2019-11-12 NOTE — Progress Notes (Signed)
   Covid-19 Vaccination Clinic  Name:  Amber Santos    MRN: [id number] DOB: [birth year]  11/12/2019  [name] was observed post Covid-19 immunization for 15 minutes without incidence. She was provided with Vaccine Information Sheet and instruction to access the V-Safe system.   [name] was instructed to call 911 with any severe reactions post vaccine: Marland Kitchen Difficulty breathing  . Swelling of your face and throat  . A fast heartbeat  . A bad rash all over your body  . Dizziness and weakness    Immunizations Administered    Name Date Dose VIS Date Route   Pfizer COVID-19 Vaccine 11/12/2019  1:46 PM 0.3 mL 09/15/2019 Intramuscular   Manufacturer: [place]   Lot: [id number]   [place]: [id number]

## 2019-11-26 ENCOUNTER — Ambulatory Visit: Payer: Medicare Other

## 2019-12-06 ENCOUNTER — Other Ambulatory Visit: Payer: Self-pay | Admitting: Internal Medicine

## 2019-12-07 ENCOUNTER — Ambulatory Visit: Payer: Medicare HMO | Attending: Internal Medicine

## 2019-12-07 DIAGNOSIS — Z23 Encounter for immunization: Secondary | ICD-10-CM | POA: Insufficient documentation

## 2019-12-07 NOTE — Progress Notes (Signed)
   Covid-19 Vaccination Clinic  Name:  [name]    MRN: [id number] DOB: [birth year]  12/07/2019  [name] was observed post Covid-19 immunization for 15 minutes without incident. She was provided with Vaccine Information Sheet and instruction to access the V-Safe system.   [name] was instructed to call 911 with any severe reactions post vaccine: Marland Kitchen Difficulty breathing  . Swelling of face and throat  . A fast heartbeat  . A bad rash all over body  . Dizziness and weakness   Immunizations Administered    Name Date Dose VIS Date Route   Pfizer COVID-19 Vaccine 12/07/2019  8:35 AM 0.3 mL 09/15/2019 Intramuscular   Manufacturer: [place]   Lot: [id number]   [place]: [id number]

## 2020-04-04 DIAGNOSIS — H524 Presbyopia: Secondary | ICD-10-CM | POA: Diagnosis not present

## 2020-04-04 DIAGNOSIS — Z0100 Encounter for examination of eyes and vision without abnormal findings: Secondary | ICD-10-CM | POA: Diagnosis not present

## 2020-05-22 ENCOUNTER — Ambulatory Visit: Payer: Medicare HMO | Admitting: Internal Medicine

## 2020-06-19 ENCOUNTER — Other Ambulatory Visit: Payer: Self-pay | Admitting: Internal Medicine

## 2020-07-02 ENCOUNTER — Ambulatory Visit: Payer: BC Managed Care – PPO | Admitting: Internal Medicine

## 2020-07-22 ENCOUNTER — Encounter: Payer: Self-pay | Admitting: Internal Medicine

## 2020-07-22 ENCOUNTER — Ambulatory Visit (INDEPENDENT_AMBULATORY_CARE_PROVIDER_SITE_OTHER): Payer: Medicare PPO | Admitting: Internal Medicine

## 2020-07-22 ENCOUNTER — Other Ambulatory Visit: Payer: Self-pay

## 2020-07-22 VITALS — BP 130/84 | HR 74 | Ht 67.0 in | Wt 192.0 lb

## 2020-07-22 DIAGNOSIS — E038 Other specified hypothyroidism: Secondary | ICD-10-CM | POA: Diagnosis not present

## 2020-07-22 DIAGNOSIS — E063 Autoimmune thyroiditis: Secondary | ICD-10-CM

## 2020-07-22 DIAGNOSIS — E0789 Other specified disorders of thyroid: Secondary | ICD-10-CM

## 2020-07-22 LAB — TSH: TSH: 2.77 u[IU]/mL (ref 0.35–4.50)

## 2020-07-22 LAB — T4, FREE: Free T4: 0.88 ng/dL (ref 0.60–1.60)

## 2020-07-22 NOTE — Patient Instructions (Addendum)
Please continue levothyroxine 75 mcg daily. ° °Take the thyroid hormone every day, with water, at least 30 minutes before breakfast, separated by at least 4 hours from: °- acid reflux medications °- calcium °- iron °- multivitamins ° °Please stop at the lab. ° °Please come back for a follow-up appointment in 1 year. °

## 2020-07-22 NOTE — Progress Notes (Signed)
Patient ID: POPPI Santos, female   DOB: January 12, 1951, 69 y.o.   MRN: [id number]   This visit occurred during the SARS-CoV-2 public health emergency.  Safety protocols were in place, including screening questions prior to the visit, additional usage of staff PPE, and extensive cleaning of exam room while observing appropriate contact time as indicated for disinfecting solutions.   HPI  Amber Santos is a 69 y.o.-year-old female, initially referred by her ObGyn Dr., Dr. Sabra Heck, returning for follow-up for Hashimoto's hypothyroidism.  Last visit 8.5 months ago.  She was diagnosed with hypothyroidism in 04/2019.  She was referred to endocrinology and states her TSH remained elevated and she was symptomatic, we started levothyroxine at 50 mcg daily initially and increased to 75 mcg daily in 08/2019.  Pt is on levothyroxine 75 mcg daily, taken: - in am - fasting - + coffee + creamer moved at least 30 minutes later - at least 30 min from b'fast - no Ca, Fe, MVI, PPIs - not on Biotin  Reviewed her TFTs: Lab Results  Component Value Date   TSH 4.48 11/09/2019   TSH 5.95 (H) 08/18/2019   TSH 10.63 (H) 06/29/2019   TSH 16.200 (H) 05/22/2019   TSH 9.940 (H) 05/01/2019   FREET4 0.80 11/09/2019   FREET4 0.74 08/18/2019   FREET4 0.66 06/29/2019   FREET4 0.77 (L) 05/22/2019   T3FREE 2.6 06/29/2019   Lab Results  Component Value Date   T3FREE 2.6 06/29/2019    Antithyroid antibodies were positive: Component     Latest Ref Rng & Units 06/29/2019  Thyroperoxidase Ab SerPl-aCnc     <9 IU/mL 241 (H)  Thyroglobulin Ab     < or = 1 IU/mL <1   In the past, she described weight gain, constipation, dry skin, hair loss.  These have improved after starting levothyroxine.  Pt denies: - feeling nodules in neck - hoarseness - dysphagia - SOB with lying down She chokes occasionally on water.  She has a palpable left thyroid but ultrasound was normal: Thyroid U/S (07/11/2019): Parenchymal  Echotexture: Moderately heterogenous Isthmus: 0.7 cm Right lobe: 5.1 x 2.3 x 1.8 cm Left lobe: 4.5 x 2.1 x 1.9 cm  No discrete nodules are seen within the thyroid gland.  IMPRESSION: Borderline enlarged heterogeneous thyroid gland without evidence for distinct thyroid nodule.  She has + FH of thyroid disorders in: mother and cousin -both hypothyroidism. No FH of thyroid cancer. No h/o radiation tx to head or neck.  No seaweed or kelp. No recent contrast studies. No herbal supplements. No Biotin use. No recent steroids use.   Pt. also has a history of breast cancer - 2013, and takes tamoxifen. She has BrCA 2. She had RxTx in 2013. She had hysterectomy in 2014.   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  ROS: Constitutional: no weight gain/no weight loss, no fatigue, no subjective hyperthermia, no subjective hypothermia Eyes: no blurry vision, no xerophthalmia ENT: no sore throat, + see HPI Cardiovascular: no CP/no SOB/no palpitations/no leg swelling Respiratory: no cough/no SOB/no wheezing Gastrointestinal: no N/no V/no D/no C/no acid reflux Musculoskeletal: no muscle aches/no joint aches Skin: no rashes, no hair loss Neurological: no tremors/no numbness/no tingling/no dizziness  I reviewed pt's medications, allergies, PMH, social hx, family hx, and changes were documented in the history of present illness. Otherwise, unchanged from my initial visit note.  Past Medical History:  Diagnosis Date  . Anemia   . Breast cancer ([place]) 2013   high grade ductal ca left breast  . Personal history of radiation therapy 2013   Left Breast Cancer  . Urinary incontinence    Past Surgical History:  Procedure Laterality Date  . ANTERIOR CERVICAL DECOMP/DISCECTOMY FUSION  01/10/2009   C4-7  . AXILLARY LYMPH NODE BIOPSY  07/25/12   left  . BREAST LUMPECTOMY Left 06/17/2012   left partial mastectomy ,radiation  .  [place]  . CYSTOSCOPY N/A 05/01/2013   Procedure: CYSTOSCOPY;  Surgeon: Lyman Speller, MD;  Location: [facility];  Service: Gynecology;  Laterality: N/A;  . ROBOTIC ASSISTED TOTAL HYSTERECTOMY WITH BILATERAL SALPINGO OOPHERECTOMY Bilateral 05/01/2013   Procedure: ROBOTIC ASSISTED TOTAL HYSTERECTOMY WITH BILATERAL SALPINGO OOPHORECTOMY;  Surgeon: Lyman Speller, MD;  Location: [facility];  Service: Gynecology;  Laterality: Bilateral;   Social History   Socioeconomic History  . Marital status: Married    Spouse name: Not on file  . Number of children: 2  . Years of education: Not on file  . Highest education level: Not on file  Occupational History  . Occupation: Associate Professor: [employer]  Tobacco Use  . Smoking status: Former Smoker    Packs/day: 0.50    Years: 12.00    Pack years: 6.00    Quit date: 06/17/1984    Years since quitting: 36.1  . Smokeless tobacco: Never Used  Vaping Use  . Vaping Use: Never used  Substance and Sexual Activity  . Alcohol use: Yes    Alcohol/week: 1.0 - 2.0 standard drink    Types: 1 - 2 Glasses of wine per week  . Drug use: No  . Sexual activity: Not Currently    Birth control/protection: Surgical, Abstinence  Other Topics Concern  . Not on file  Social History Narrative  . Not on file   Social Determinants of Health   Financial Resource Strain:   . Difficulty of Paying Living Expenses: Not on file  Food Insecurity:   . Worried About Charity fundraiser in the Last Year: Not on file  . Ran Out of Food in the Last Year: Not on file  Transportation Needs:   . Lack of Transportation (Medical): Not on file  . Lack of Transportation (Non-Medical): Not on file  Physical Activity:   . Days of Exercise per Week: Not on file  . Minutes of Exercise per Session: Not on file  Stress:   . Feeling of Stress : Not on file  Social Connections:   . Frequency of Communication with Friends and Family: Not on  file  . Frequency of Social Gatherings with Friends and Family: Not on file  . Attends Religious Services: Not on file  . Active Member of Clubs or Organizations: Not on file  . Attends Archivist Meetings: Not on file  . Marital Status: Not on file  Intimate Partner Violence:   . Fear of Current or Ex-Partner: Not on file  . Emotionally Abused: Not on file  . Physically Abused: Not on file  . Sexually Abused: Not on file   Current Outpatient Medications on File Prior to Visit  Medication Sig Dispense Refill  . levothyroxine (SYNTHROID) 75 MCG tablet TAKE 1 TABLET BY MOUTH EVERY DAY 90 tablet 1  . tamoxifen (NOLVADEX) 20 MG tablet TAKE 1 TABLET BY MOUTH EVERY DAY 90 tablet 4   No current facility-administered medications  on file prior to visit.   Allergies  Allergen Reactions  . Codeine Other (See Comments)    headaches   Family History  Problem Relation Age of Onset  . Breast cancer Mother 74  . Cancer Maternal Aunt        breast, pancreatic  . Colon cancer Maternal Uncle   . Breast cancer Maternal Uncle   . Breast cancer Maternal Aunt 28  . Colon cancer Maternal Grandmother   . Kidney cancer Maternal Uncle   . Breast cancer Cousin        2 maternal cousins dx in 105s  . Breast cancer Cousin     PE: BP 130/84   Pulse 74   Ht _0  (1.702 m)   Wt 192 lb (87.1 kg)   LMP 10/05/2004 (Approximate)   SpO2 98%   BMI 30.07 kg/m  Wt Readings from Last 3 Encounters:  07/22/20 192 lb (87.1 kg)  11/09/19 193 lb (87.5 kg)  09/25/19 194 lb 4.8 oz (88.1 kg)   Constitutional: overweight, in NAD Eyes: PERRLA, EOMI, no exophthalmos ENT: moist mucous membranes, + palpable left thyroid lobe, no cervical lymphadenopathy Cardiovascular: RRR, No MRG Respiratory: CTA B Gastrointestinal: abdomen soft, NT, ND, BS+ Musculoskeletal: no deformities, strength intact in all 4 Skin: moist, warm, no rashes Neurological: + very slight tremor with outstretched hands, DTR normal in  all 4  ASSESSMENT: 1. Hypothyroidism 2/2 Hashimoto's thyroiditis  2. Left thyroid prominence  PLAN:  1. Patient with recent diagnosis of Hashimoto's hypothyroidism, recently started on levothyroxine.  Last year, her TSH returned high and she also had weight gain, constipation, hair loss, dry skin.  We started 50 mcg levothyroxine and increase the dose in 08/2019 to 75 mcg daily - latest thyroid labs reviewed with pt >> normal, but at the upper limit of normal: Lab Results  Component Value Date   TSH 4.48 11/09/2019   - she continues on LT4 75 mcg daily - pt feels good on this dose. No complaints. - we discussed about taking the thyroid hormone every day, with water, >30 minutes before breakfast, separated by >4 hours from acid reflux medications, calcium, iron, multivitamins. Pt. is taking it correctly now.  At last visit I advised her to separate coffee with creamer more from levothyroxine - will check thyroid tests today: TSH and fT4 - If labs are abnormal, she will need to return for repeat TFTs in 1.5 months  2.  Left thyroid prominence -She has a left prominent thyroid gland.  We did check a thyroid ultrasound and this did not show nodules, so her thyroid is probably slightly rotated.  She does have heterogeneity and slight enlargement of her thyroid most likely due to Hashimoto's thyroiditis. -She denies neck compression symptoms except for choking occasionally with water if she drinks too fast.  Component     Latest Ref Rng & Units 07/22/2020  TSH     0.35 - 4.50 uIU/mL 2.77  T4,Free(Direct)     0.60 - 1.60 ng/dL 0.88  Normal TFTs.  Philemon Kingdom, MD PhD [facility] Endocr[facility]

## 2020-08-06 ENCOUNTER — Inpatient Hospital Stay: Payer: Medicare PPO

## 2020-08-06 ENCOUNTER — Inpatient Hospital Stay: Payer: Medicare PPO | Admitting: Oncology

## 2020-08-08 ENCOUNTER — Other Ambulatory Visit: Payer: Self-pay

## 2020-08-08 DIAGNOSIS — C50412 Malignant neoplasm of upper-outer quadrant of left female breast: Secondary | ICD-10-CM

## 2020-08-08 DIAGNOSIS — Z170 Estrogen receptor positive status [ER+]: Secondary | ICD-10-CM

## 2020-08-09 ENCOUNTER — Other Ambulatory Visit: Payer: Self-pay

## 2020-08-09 ENCOUNTER — Inpatient Hospital Stay: Payer: Medicare PPO | Attending: Oncology

## 2020-08-09 ENCOUNTER — Other Ambulatory Visit: Payer: Self-pay | Admitting: Oncology

## 2020-08-09 ENCOUNTER — Inpatient Hospital Stay (HOSPITAL_BASED_OUTPATIENT_CLINIC_OR_DEPARTMENT_OTHER): Payer: Medicare PPO | Admitting: Oncology

## 2020-08-09 ENCOUNTER — Ambulatory Visit: Payer: Medicare PPO

## 2020-08-09 VITALS — BP 129/84 | HR 60 | Temp 97.7°F | Resp 18 | Ht 67.0 in | Wt 193.3 lb

## 2020-08-09 DIAGNOSIS — Z87891 Personal history of nicotine dependence: Secondary | ICD-10-CM | POA: Diagnosis not present

## 2020-08-09 DIAGNOSIS — Z1509 Genetic susceptibility to other malignant neoplasm: Secondary | ICD-10-CM

## 2020-08-09 DIAGNOSIS — Z170 Estrogen receptor positive status [ER+]: Secondary | ICD-10-CM

## 2020-08-09 DIAGNOSIS — C50912 Malignant neoplasm of unspecified site of left female breast: Secondary | ICD-10-CM | POA: Diagnosis present

## 2020-08-09 DIAGNOSIS — Z79810 Long term (current) use of selective estrogen receptor modulators (SERMs): Secondary | ICD-10-CM | POA: Diagnosis not present

## 2020-08-09 DIAGNOSIS — Z923 Personal history of irradiation: Secondary | ICD-10-CM | POA: Diagnosis not present

## 2020-08-09 DIAGNOSIS — Z1501 Genetic susceptibility to malignant neoplasm of breast: Secondary | ICD-10-CM | POA: Diagnosis not present

## 2020-08-09 DIAGNOSIS — C50412 Malignant neoplasm of upper-outer quadrant of left female breast: Secondary | ICD-10-CM

## 2020-08-09 DIAGNOSIS — Z1231 Encounter for screening mammogram for malignant neoplasm of breast: Secondary | ICD-10-CM

## 2020-08-09 LAB — CBC WITH DIFFERENTIAL (CANCER CENTER ONLY)
Abs Immature Granulocytes: 0 10*3/uL (ref 0.00–0.07)
Basophils Absolute: 0 10*3/uL (ref 0.0–0.1)
Basophils Relative: 1 %
Eosinophils Absolute: 0.1 10*3/uL (ref 0.0–0.5)
Eosinophils Relative: 2 %
HCT: 36.2 % (ref 36.0–46.0)
Hemoglobin: 11.9 g/dL — ABNORMAL LOW (ref 12.0–15.0)
Immature Granulocytes: 0 %
Lymphocytes Relative: 39 %
Lymphs Abs: 1.7 10*3/uL (ref 0.7–4.0)
MCH: 26.6 pg (ref 26.0–34.0)
MCHC: 32.9 g/dL (ref 30.0–36.0)
MCV: 81 fL (ref 80.0–100.0)
Monocytes Absolute: 0.5 10*3/uL (ref 0.1–1.0)
Monocytes Relative: 12 %
Neutro Abs: 2 10*3/uL (ref 1.7–7.7)
Neutrophils Relative %: 46 %
Platelet Count: 229 10*3/uL (ref 150–400)
RBC: 4.47 MIL/uL (ref 3.87–5.11)
RDW: 12.8 % (ref 11.5–15.5)
WBC Count: 4.3 10*3/uL (ref 4.0–10.5)
nRBC: 0 % (ref 0.0–0.2)

## 2020-08-09 LAB — CMP (CANCER CENTER ONLY)
ALT: 33 U/L (ref 0–44)
AST: 29 U/L (ref 15–41)
Albumin: 3.7 g/dL (ref 3.5–5.0)
Alkaline Phosphatase: 50 U/L (ref 38–126)
Anion gap: 7 (ref 5–15)
BUN: 17 mg/dL (ref 8–23)
CO2: 26 mmol/L (ref 22–32)
Calcium: 9 mg/dL (ref 8.9–10.3)
Chloride: 105 mmol/L (ref 98–111)
Creatinine: 0.72 mg/dL (ref 0.44–1.00)
GFR, Estimated: >60 mL/min (ref >60)
Glucose, Bld: 96 mg/dL (ref 70–99)
Potassium: 3.9 mmol/L (ref 3.5–5.1)
Sodium: 138 mmol/L (ref 135–145)
Total Bilirubin: 0.7 mg/dL (ref 0.3–1.2)
Total Protein: 7.1 g/dL (ref 6.5–8.1)

## 2020-08-09 NOTE — Progress Notes (Signed)
ID: Amber Santos   DOB: 05/15/1951  MR#: [id number]  CSN#:[id number]  Patient Care Team: Anderson, Teresa, FNP as PCP - General (Nurse Practitioner) Magrinat, Gustav C, MD as Consulting Physician (Oncology) Miller, Mary S, MD as Consulting Physician (Gynecology) OTHER MD:   CHIEF COMPLAINT: BRCA2 positive breast cancer  CURRENT TREATMENT: Tamoxifen; intensified screening.   INTERVAL HISTORY: Amber Santos returns today for follow-up of her estrogen receptor positive breast cancer.   She continues on tamoxifen.  She is doing just fine in terms of hot flashes and vaginal wetness, neither of which are major issues for her.  Since her last visit, she has not undergone any additional studies.  Follow-up is complicated because [place].  She is due for annual mammography, last done on 07/21/2019 at [facility].  Bone density on the same day showed a T score of -1.6   REVIEW OF SYSTEMS: Amber Santos tells me her husband is doing fine after requiring a little bit of extra radiation for his prostate cancer.  They have moved to an area right near [place], [place], but she has not yet established herself with any physicians in that area.  She comes to [place] for her medical care and for her haircuts.  A detailed review of systems today was otherwise stable   COVID 19 VACCINATION STATUS: Status post Pfizer x3, with the booster July 18, 2020   HISTORY OF BREAST CANCER: From the earlier summary note:  Amber [facility] showing new calcifications in the left breast. Diagnostic left mammography 05/27/2012 confirmed a cluster of faint calcifications in the upper outer quadrant. Biopsy was performed 06/01/2012, and showed atypical ductal hyperplasia ([id number]). Accordingly the patient underwent left lumpectomy 06/17/2012. This showed ([id number]) invasive ductal carcinoma, grade 2, measuring 1.4 cm, with  associated high-grade ductal carcinoma in situ. There were 2 additional areas of ductal carcinoma in situ, but margins were clear. The invasive tumor was 100% estrogen receptor and 100% progesterone receptor positive, with an MIB-1 of 20%. It was HER-2 negative.   On 06/26/2012 the patient underwent bilateral breast MRI. This found a seroma in the upper outer quadrant of the left breast measuring 6 cm. There was no other area of suspicious enhancement in the left breast, no findings of concern in the right breast, and no axillary or internal mammary adenopathy.   The patient's subsequent history is as detailed below   PAST MEDICAL HISTORY: Past Medical History:  Diagnosis Date  . Anemia   . Breast cancer (HCC) 2013   high grade ductal ca left breast  . Personal history of radiation therapy 2013   Left Breast Cancer  . Urinary incontinence     PAST SURGICAL HISTORY: Past Surgical History:  Procedure Laterality Date  . ANTERIOR CERVICAL DECOMP/DISCECTOMY FUSION  01/10/2009   C4-7  . AXILLARY LYMPH NODE BIOPSY  07/25/12   left  . BREAST LUMPECTOMY Left 06/17/2012   left partial mastectomy ,radiation  . CESAREAN SECTION  1986, 1987  . CYSTOSCOPY N/A 05/01/2013   Procedure: CYSTOSCOPY;  Surgeon: Mary Suzanne Miller, MD;  Location: [facility];  Service: Gynecology;  Laterality: N/A;  . ROBOTIC ASSISTED TOTAL HYSTERECTOMY WITH BILATERAL SALPINGO OOPHERECTOMY Bilateral 05/01/2013   Procedure: ROBOTIC ASSISTED TOTAL HYSTERECTOMY WITH BILATERAL SALPINGO OOPHORECTOMY;  Surgeon: Mary Suzanne Miller, MD;  Location: [facility];  Service: Gynecology;  Laterality: Bilateral;    FAMILY HISTORY Family History  Problem Relation   Age of Onset  . Breast cancer Mother 39  . Cancer Maternal Aunt        breast, pancreatic  . Colon cancer Maternal Uncle   . Breast cancer Maternal Uncle   . Breast cancer Maternal Aunt 28  . Colon cancer Maternal Grandmother   . Kidney cancer Maternal Uncle   . Breast cancer  Cousin        2 maternal cousins dx in 67s  . Breast cancer Cousin    The patient's father died at the age of 13 from an accident. The patient's mother is living, currently 44 years old. She has a history of breast cancer, postmenopausal. The patient has one brother, no sisters. In addition the patient has a maternal aunt who was diagnosed with breast cancer at age 103 and a cousin who was diagnosed with breast cancer at age 74 (also found to be BRCA positive, followed at [facility]). She says 2 maternal uncles had a history of colon cancer.   GYNECOLOGIC HISTORY: Menarche age 71, first live birth age 68, which of course increases the risk of breast cancer. She is GX P2. 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 She was very active in [employer]. Her husband Amber Santos is V.P. at [facility] [employer]. He had surgery for prostate cancer in 2014 Amber Santos lives in [place] and will have his second child in 2021 and Amber Amber Santos lives in [place] and married in 2020. Both work for [employer]: In place   HEALTH MAINTENANCE: Social History   Tobacco Use  . Smoking status: Former Smoker    Packs/day: 0.50    Years: 12.00    Pack years: 6.00    Quit date: 06/17/1984    Years since quitting: 36.1  . Smokeless tobacco: Never Used  Vaping Use  . Vaping Use: Never used  Substance Use Topics  . Alcohol use: Yes    Alcohol/week: 1.0 - 2.0 standard drink    Types: 1 - 2 Glasses of wine per week  . Drug use: No     Colonoscopy: 2005?  PAP: 2012  [facility]  Lipid panel:  Allergies  Allergen Reactions  . Codeine Other (See Comments)    headaches    Current Outpatient Medications  Medication Sig Dispense Refill  . levothyroxine (SYNTHROID) 75 MCG tablet TAKE 1 TABLET BY MOUTH EVERY DAY 90 tablet 1  . tamoxifen (NOLVADEX) 20  MG tablet TAKE 1 TABLET BY MOUTH EVERY DAY 90 tablet 4   No current facility-administered medications for this visit.    OBJECTIVE: White woman in no acute distress Vitals:   08/09/20 1152  BP: 129/84  Pulse: 60  Resp: 18  Temp: 97.7 F (36.5 C)  SpO2: 100%     Body mass index is 30.28 kg/m.    ECOG FS: 1  Sclerae unicteric, EOMs intact Wearing a mask No cervical or supraclavicular adenopathy Lungs no rales or rhonchi Heart regular rate and rhythm Abd soft, nontender, positive bowel sounds MSK no focal spinal tenderness, no upper extremity lymphedema Neuro: nonfocal, well oriented, appropriate affect Breasts: The right breast is unremarkable.  The left breast is status post lumpectomy and radiation.  There is no evidence of local recurrence.  Both axillae are benign.   LAB RESULTS: Lab Results  Component Value Date   WBC 4.3 08/09/2020   NEUTROABS  2.0 08/09/2020   HGB 11.9 (L) 08/09/2020   HCT 36.2 08/09/2020   MCV 81.0 08/09/2020   PLT 229 08/09/2020      Chemistry      Component Value Date/Time   NA 138 08/09/2020 1119   NA 140 06/09/2017 1217      Component Value Date/Time   CALCIUM 9.0 08/09/2020 1119   CALCIUM 9.6 06/09/2017 1217       No results found for: LABCA2  No components found for: ZESPQ330  No results for input(s): INR in the last 168 hours.   Urinalysis No results found for: COLORURINE   STUDIES: No results found.   ASSESSMENT: 69 y.o. BRCA-2 positive Amber Santos woman status post left lumpectomy with no axillary lymph node sampling 06/17/2012 for a pT1c pNX, clinical stage IA invasive ductal carcinoma, grade 2, 100% estrogen and 100% progesterone receptor positive, HER-2 negative, with an MIB-1 of 16%.  (1) left sentinel lymph node biopsy 07/25/2012 showed 0 of 3 lymph nodes positive  (2) Oncotype DX shows a recurrence score of 21, predicting a distant recurrence risk within 10 years of 14% if the patient's only systemic treatment  is tamoxifen for 5 years  (3) adjuvant radiation therapy completed 10/03/2012  (4) tamoxifen started January 2014; the plan is to continue to January 2024  (a) bone density 07/21/2019 shows a T score of -1.6.  (5) BRCA-2 positive  (a) s/p TAH-BSO 05/01/2013  (b) intensified screening: Breast MRI April and breast mammography October yearly   PLAN: Amber Santos is now 8 years out from definitive surgery for her breast cancer with no evidence of disease recurrence.  This is very favorable.  [place]  [place] not established herself with any physicians there however.  We tried to get her mammogram today at the [facility] but unfortunately she had her most recent Covid vaccine 07/18/2020 and really we have to wait at least 6 weeks after that so that we do not get false positive adenopathy  We are going to try to get her mammography scheduled in the second week in December in [place].  She is also going to continue intensified screening with an MRI of the breast in April.  I am scheduling that at the [facility].  Otherwise she will see me again December 2022.  We will try to get her mammography on the same day but I cannot schedule it given the recent limitation allowing [place] only to schedule appointments 1 year in advance  She gave me instructions on how to revive the bulb she gave me last year  Total encounter time 25 minutes.*   Codie Krogh, Virgie Dad, MD  08/09/20 12:48 PM Medical Oncology and Hematology [facility] [place], [place] [address] Tel. [phone]    Fax. [phone]   I, Wilburn Santos, am acting as scribe for Dr. Virgie Dad. Rigby Swamy.  I, Lurline Del MD, have reviewed the above documentation for accuracy and completeness, and I agree with the above.   *Total Encounter Time as defined by the Centers for Medicare and Medicaid Services includes, in addition to the face-to-face time  of a patient visit (documented in the note above) non-face-to-face time: obtaining and reviewing outside history, ordering and reviewing medications, tests or procedures, care coordination (communications with other health care professionals or caregivers) and documentation in the medical record.

## 2020-12-11 ENCOUNTER — Other Ambulatory Visit: Payer: Self-pay | Admitting: Internal Medicine

## 2020-12-12 ENCOUNTER — Other Ambulatory Visit: Payer: Self-pay | Admitting: Oncology

## 2020-12-16 ENCOUNTER — Ambulatory Visit: Payer: Medicare PPO | Admitting: Internal Medicine

## 2020-12-16 ENCOUNTER — Other Ambulatory Visit: Payer: Self-pay

## 2020-12-16 ENCOUNTER — Encounter: Payer: Self-pay | Admitting: Internal Medicine

## 2020-12-16 VITALS — BP 120/80 | HR 77 | Ht 67.0 in | Wt 199.0 lb

## 2020-12-16 DIAGNOSIS — E063 Autoimmune thyroiditis: Secondary | ICD-10-CM | POA: Diagnosis not present

## 2020-12-16 DIAGNOSIS — E038 Other specified hypothyroidism: Secondary | ICD-10-CM | POA: Diagnosis not present

## 2020-12-16 DIAGNOSIS — E0789 Other specified disorders of thyroid: Secondary | ICD-10-CM | POA: Diagnosis not present

## 2020-12-16 NOTE — Patient Instructions (Addendum)
Please continue levothyroxine 75 mcg daily.  Take the thyroid hormone every day, with water, at least 30 minutes before breakfast, separated by at least 4 hours from: - acid reflux medications - calcium - iron - multivitamins  Please move calcium at least 4h after b'fast.  Please stop at the lab.  Please come back for a follow-up appointment in 1 year.

## 2020-12-16 NOTE — Progress Notes (Signed)
Patient ID: Amber Santos, female   DOB: 03/26/1951, 70 y.o.   MRN: [id number]   This visit occurred during the SARS-CoV-2 public health emergency.  Safety protocols were in place, including screening questions prior to the visit, additional usage of staff PPE, and extensive cleaning of exam room while observing appropriate contact time as indicated for disinfecting solutions.   HPI  Amber Santos is a 70 y.o.-year-old female, initially referred by her ObGyn Dr., Dr. Sabra Heck, returning for follow-up for Hashimoto's hypothyroidism.  Last visit 1 year ago.  Interim history: She had Covid19 in 10/2020, got the Ab infusion >> recovered completely, no long Covid. She is trying to find a new PCP at the [facility]. She is not sleeping well. She gained weight since last OV. She is exercising more.  Reviewed history: She was diagnosed with hypothyroidism in 04/2019.  She was referred to endocrinology and states her TSH remained elevated and she was symptomatic, we started levothyroxine at 50 mcg daily initially and increased to 75 mcg daily in 08/2019.  Pt is on levothyroxine 75 mcg daily, taken: - in am - fasting - + Coffee and creamer moved at least 30 minutes later - at least 30 min from b'fast - + Calcium at the same time as levothyroxine (starting 09/2020) - no Fe, MVI, PPIs - not on Biotin - + vitamin D3, Zn, Vitamin C  Reviewed her TFTs: Lab Results  Component Value Date   TSH 2.77 07/22/2020   TSH 4.48 11/09/2019   TSH 5.95 (H) 08/18/2019   TSH 10.63 (H) 06/29/2019   TSH 16.200 (H) 05/22/2019   TSH 9.940 (H) 05/01/2019   FREET4 0.88 07/22/2020   FREET4 0.80 11/09/2019   FREET4 0.74 08/18/2019   FREET4 0.66 06/29/2019   FREET4 0.77 (L) 05/22/2019   T3FREE 2.6 06/29/2019   Lab Results  Component Value Date   T3FREE 2.6 06/29/2019    Antithyroid antibodies were positive: Component     Latest Ref Rng & Units 06/29/2019  Thyroperoxidase Ab SerPl-aCnc     <9 IU/mL 241 (H)   Thyroglobulin Ab     < or = 1 IU/mL <1   In the past, she described weight gain, constipation, dry skin, hair loss.  These have improved after starting levothyroxine.  Pt denies: - feeling nodules in neck - hoarseness - dysphagia - choking, only occasional with water - SOB with lying down  She has a palpable left thyroid but ultrasound was normal: Thyroid U/S (07/11/2019): Parenchymal Echotexture: Moderately heterogenous Isthmus: 0.7 cm Right lobe: 5.1 x 2.3 x 1.8 cm Left lobe: 4.5 x 2.1 x 1.9 cm  No discrete nodules are seen within the thyroid gland.  IMPRESSION: Borderline enlarged heterogeneous thyroid gland without evidence for distinct thyroid nodule.  She has + FH of thyroid disorders in: mother and cousin -both hypothyroidism. No FH of thyroid cancer. No h/o radiation tx to head or neck.  No seaweed or kelp. No recent contrast studies. No herbal supplements. No Biotin use. No recent steroids use.   Pt. also has a history of breast cancer - 2013, and takes tamoxifen. She has BrCA 2. She had RxTx in 2013. She had hysterectomy in 2014.   She is a retired Pharmacist, hospital.  [place]  She is trying to establish care with a new PCP there.  ROS: Constitutional: + weight gain/no weight loss, no fatigue, no subjective hyperthermia, no subjective hypothermia, + insomnia Eyes: no blurry vision, no  xerophthalmia ENT: no sore throat, + see HPI Cardiovascular: no CP/no SOB/no palpitations/no leg swelling Respiratory: no cough/no SOB/no wheezing Gastrointestinal: no N/no V/no D/no C/no acid reflux Musculoskeletal: no muscle aches/no joint aches Skin: no rashes, no hair loss Neurological: no tremors/no numbness/no tingling/no dizziness  I reviewed pt's medications, allergies, PMH, social hx, family hx, and changes were documented in the history of present illness. Otherwise, unchanged from my initial visit note.  Past Medical History:  Diagnosis Date  .  Anemia   . Breast cancer ([place]) 2013   high grade ductal ca left breast  . Personal history of radiation therapy 2013   Left Breast Cancer  . Urinary incontinence    Past Surgical History:  Procedure Laterality Date  . ANTERIOR CERVICAL DECOMP/DISCECTOMY FUSION  01/10/2009   C4-7  . AXILLARY LYMPH NODE BIOPSY  07/25/12   left  . BREAST LUMPECTOMY Left 06/17/2012   left partial mastectomy ,radiation  . [place]  . CYSTOSCOPY N/A 05/01/2013   Procedure: CYSTOSCOPY;  Surgeon: Lyman Speller, MD;  Location: [facility];  Service: Gynecology;  Laterality: N/A;  . ROBOTIC ASSISTED TOTAL HYSTERECTOMY WITH BILATERAL SALPINGO OOPHERECTOMY Bilateral 05/01/2013   Procedure: ROBOTIC ASSISTED TOTAL HYSTERECTOMY WITH BILATERAL SALPINGO OOPHORECTOMY;  Surgeon: Lyman Speller, MD;  Location: [facility];  Service: Gynecology;  Laterality: Bilateral;   Social History   Socioeconomic History  . Marital status: Married    Spouse name: Not on file  . Number of children: 2  . Years of education: Not on file  . Highest education level: Not on file  Occupational History  . Occupation: Associate Professor: [employer]  Tobacco Use  . Smoking status: Former Smoker    Packs/day: 0.50    Years: 12.00    Pack years: 6.00    Quit date: 06/17/1984    Years since quitting: 36.5  . Smokeless tobacco: Never Used  Vaping Use  . Vaping Use: Never used  Substance and Sexual Activity  . Alcohol use: Yes    Alcohol/week: 1.0 - 2.0 standard drink    Types: 1 - 2 Glasses of wine per week  . Drug use: No  . Sexual activity: Not Currently    Birth control/protection: Surgical, Abstinence  Other Topics Concern  . Not on file  Social History Narrative  . Not on file   Social Determinants of Health   Financial Resource Strain: Not on file  Food Insecurity: Not on file  Transportation Needs: Not on file  Physical Activity: Not on file  Stress: Not on file  Social Connections:  Not on file  Intimate Partner Violence: Not on file   Current Outpatient Medications on File Prior to Visit  Medication Sig Dispense Refill  . levothyroxine (SYNTHROID) 75 MCG tablet TAKE 1 TABLET BY MOUTH EVERY DAY 90 tablet 1  . tamoxifen (NOLVADEX) 20 MG tablet TAKE 1 TABLET BY MOUTH EVERY DAY 90 tablet 2   No current facility-administered medications on file prior to visit.   Allergies  Allergen Reactions  . Codeine Other (See Comments)    headaches   Family History  Problem Relation Age of Onset  . Breast cancer Mother 30  . Cancer Maternal Aunt        breast, pancreatic  . Colon cancer Maternal Uncle   . Breast cancer Maternal Uncle   . Breast cancer Maternal Aunt 28  . Colon cancer Maternal Grandmother   . Kidney cancer Maternal  Uncle   . Breast cancer Cousin        2 maternal cousins dx in 20s  . Breast cancer Cousin     PE: BP 120/80 (BP Location: Right Arm, Patient Position: Sitting, Cuff Size: Normal)   Pulse 77   Ht _0  (1.702 m)   Wt 199 lb (90.3 kg)   LMP 10/05/2004 (Approximate)   SpO2 97%   BMI 31.17 kg/m  Wt Readings from Last 3 Encounters:  12/16/20 199 lb (90.3 kg)  08/09/20 193 lb 4.8 oz (87.7 kg)  07/22/20 192 lb (87.1 kg)   Constitutional: overweight, in NAD Eyes: PERRLA, EOMI, no exophthalmos ENT: moist mucous membranes, + palpable left thyroid lobe, no cervical lymphadenopathy Cardiovascular: RRR, No MRG Respiratory: CTA B Gastrointestinal: abdomen soft, NT, ND, BS+ Musculoskeletal: no deformities, strength intact in all 4 Skin: moist, warm, no rashes Neurological: no tremor with outstretched hands, DTR normal in all 4  ASSESSMENT: 1. Hypothyroidism 2/2 Hashimoto's thyroiditis  2. Left thyroid prominence  PLAN:  1. Patient with Hashimoto's hypothyroidism, initially with weight gain, constipation, hair loss, dry skin, improved after starting levothyroxine.  We started 50 mcg levothyroxine and increase the dose in 08/2019 to 75 mcg  daily. - latest thyroid labs reviewed with pt >> normal: Lab Results  Component Value Date   TSH 2.77 07/22/2020   - she continues on LT4 75 mcg daily - pt feels good on this dose, but does have insomnia and also gained 6 pounds in the last 3 months. - we discussed about taking the thyroid hormone every day, with water, >30 minutes before breakfast, separated by >4 hours from acid reflux medications, calcium, iron, multivitamins. In the past, she was drinking coffee with creamer to close to levothyroxine but we moved the coffee later.  She now moved the coffee approximately 30 minutes later, however, she started calcium, which she takes at the same time as it levothyroxine (!).  I explained that in this case, she is most likely not absorbing the levothyroxine, which could explain her weight gain.  I advised her to move calcium at least 4 hours later - will check thyroid tests in 6 weeks: TSH and fT4 (printed her labs to take with her) - If labs are abnormal, she will need to return for repeat TFTs in 1.5 months -[place]  However, if she does not do this until next visit, we can do a virtual visit next year.  2.  Left thyroid prominence -She continues to have a left prominent thyroid gland. -Reviewed her thyroid ultrasound from 07/2019: No nodules, so her thyroid is probably slightly rotated.  She does have heterogeneity and slight enlargement of the thyroid most likely due to Hashimoto's thyroiditis-related inflammation -She denies neck compression symptoms but occasionally choking with water if she drinks too fast -No further investigation is needed for this for now  Philemon Kingdom, MD PhD [facility] Endocrinology

## 2021-02-13 ENCOUNTER — Other Ambulatory Visit: Payer: Medicare PPO

## 2021-03-05 ENCOUNTER — Other Ambulatory Visit: Payer: Self-pay | Admitting: Oncology

## 2021-03-06 ENCOUNTER — Telehealth: Payer: Self-pay | Admitting: Oncology

## 2021-03-06 NOTE — Telephone Encounter (Signed)
Changed appt time per 6/1 sch msg. Mailed updated calendar to pt.

## 2021-03-21 ENCOUNTER — Other Ambulatory Visit: Payer: Self-pay

## 2021-03-21 ENCOUNTER — Ambulatory Visit
Admission: RE | Admit: 2021-03-21 | Discharge: 2021-03-21 | Disposition: A | Discharge status: Home / Self Care | Payer: Medicare PPO | Source: Ambulatory Visit | Attending: Oncology | Admitting: Oncology

## 2021-03-21 DIAGNOSIS — C50412 Malignant neoplasm of upper-outer quadrant of left female breast: Secondary | ICD-10-CM

## 2021-03-21 DIAGNOSIS — Z1501 Genetic susceptibility to malignant neoplasm of breast: Secondary | ICD-10-CM

## 2021-03-21 MED ORDER — GADOBUTROL 1 MMOL/ML IV SOLN
9.0000 mL | Freq: Once | INTRAVENOUS | Status: AC | PRN
  Administered 2021-03-21: 9 mL via INTRAVENOUS

## 2021-04-01 ENCOUNTER — Telehealth: Payer: Self-pay | Admitting: *Deleted

## 2021-04-01 NOTE — Telephone Encounter (Signed)
Message received from the patient stating she is still awaiting results of MRI of the breast done 6/17.  She knows the films from her mammogram done locally in [place] was being requested - but she is concerned due to no results yet.  This RN left a VM on Volanda Napoleon at the [facility] per above with request for her to call the patient with status of requested films.  This RN left number for pt contact as well as number for this RN

## 2021-05-17 ENCOUNTER — Telehealth: Payer: Self-pay

## 2021-05-17 NOTE — Telephone Encounter (Signed)
Inbound fax from [place] requesting pt's last 2 OV notes and labs. Clinical notes routed via Epic

## 2021-06-02 ENCOUNTER — Other Ambulatory Visit: Payer: Self-pay | Admitting: Internal Medicine

## 2021-09-11 ENCOUNTER — Other Ambulatory Visit: Payer: Self-pay | Admitting: Oncology

## 2021-09-14 NOTE — Progress Notes (Signed)
ID: Amber Santos   DOB: Jul 09, 1951  MR#: [id number]  KGY#:[id number]  Patient Care Team: Vicenta Aly, FNP as PCP - General (Nurse Practitioner) Eeshan Verbrugge, Virgie Dad, MD as Consulting Physician (Oncology) Megan Salon, MD as Consulting Physician (Gynecology) OTHER MD:   CHIEF COMPLAINT: BRCA2 positive breast cancer  CURRENT TREATMENT: Tamoxifen; intensified screening.   INTERVAL HISTORY: Amber Santos returns today for follow-up of her estrogen receptor positive breast cancer.   She continues on tamoxifen.  She is doing just fine in terms of hot flashes and vaginal wetness, neither of which are major issues for her.  Follow-up is complicated because she now lives near [place] at [employer]. Since her last visit, she underwent breast MRI on 03/21/2021 showing: breast composition B; no evidence of malignancy.   REVIEW OF SYSTEMS: Amber Santos is enjoying living in her new environment and at this point is ready to move medical care closer to home.  This is discussed below.  COVID 19 VACCINATION STATUS: Status post Pfizer x3, with the booster July 18, 2020   HISTORY OF BREAST CANCER: From the earlier summary note:  Brithney had routine screening mammography at [facility] 05/23/2022 showing new calcifications in the left breast. Diagnostic left mammography 05/27/2012 confirmed a cluster of faint calcifications in the upper outer quadrant. Biopsy was performed 06/01/2012, and showed atypical ductal hyperplasia ([id number]). Accordingly the patient underwent left lumpectomy 06/17/2012. This showed ([id number]) invasive ductal carcinoma, grade 2, measuring 1.4 cm, with associated high-grade ductal carcinoma in situ. There were 2 additional areas of ductal carcinoma in situ, but margins were clear. The invasive tumor was 100% estrogen receptor and 100% progesterone receptor positive, with an MIB-1 of 20%. It was HER-2 negative.   On 06/26/2012 the patient underwent bilateral breast MRI. This  found a seroma in the upper outer quadrant of the left breast measuring 6 cm. There was no other area of suspicious enhancement in the left breast, no findings of concern in the right breast, and no axillary or internal mammary adenopathy.   The patient's subsequent history is as detailed below   PAST MEDICAL HISTORY: Past Medical History:  Diagnosis Date   Anemia    Breast cancer ([place]) 2013   high grade ductal ca left breast   Personal history of radiation therapy 2013   Left Breast Cancer   Urinary incontinence     PAST SURGICAL HISTORY: Past Surgical History:  Procedure Laterality Date   ANTERIOR CERVICAL DECOMP/DISCECTOMY FUSION  01/10/2009   C4-7   AXILLARY LYMPH NODE BIOPSY  07/25/12   left   BREAST LUMPECTOMY Left 06/17/2012   left partial mastectomy ,radiation   [place] N/A 05/01/2013   Procedure: CYSTOSCOPY;  Surgeon: Lyman Speller, MD;  Location: [facility];  Service: Gynecology;  Laterality: N/A;   ROBOTIC ASSISTED TOTAL HYSTERECTOMY WITH BILATERAL SALPINGO OOPHERECTOMY Bilateral 05/01/2013   Procedure: ROBOTIC ASSISTED TOTAL HYSTERECTOMY WITH BILATERAL SALPINGO OOPHORECTOMY;  Surgeon: Lyman Speller, MD;  Location: [facility];  Service: Gynecology;  Laterality: Bilateral;    FAMILY HISTORY Family History  Problem Relation Age of Onset   Breast cancer Mother 69   Cancer Maternal Aunt        breast, pancreatic   Colon cancer Maternal Uncle    Breast cancer Maternal Uncle    Breast cancer Maternal Aunt 28   Colon cancer Maternal Grandmother    Kidney cancer Maternal Uncle    Breast cancer Cousin  2 maternal cousins dx in 73s   Breast cancer Cousin   The patient's father died at the age of 71 from an accident. The patient's mother is living, currently 22 years old. She has a history of breast cancer, postmenopausal. The patient has one brother, no sisters. In addition the patient has a maternal aunt who was diagnosed with  breast cancer at age 46 and a cousin who was diagnosed with breast cancer at age 49 (also found to be BRCA positive, followed at [facility]). She says 2 maternal uncles had a history of colon cancer.   GYNECOLOGIC HISTORY: Menarche age 89, first live birth age 73, which of course increases the risk of breast cancer. She is GX P2. 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 She was very active in [employer]. Her husband Amber Santos was V.P. at [employer]. He had surgery for prostate cancer in 2014 and is doing well.  Son Amber Santos lives in [place] and will have his second child in 2021 and son Amber Santos lives in [place] and married in 2020. Both work for [employer]: In place   HEALTH MAINTENANCE: Social History   Tobacco Use   Smoking status: Former    Packs/day: 0.50    Years: 12.00    Pack years: 6.00    Types: Cigarettes    Quit date: 06/17/1984    Years since quitting: 37.2   Smokeless tobacco: Never  Vaping Use   Vaping Use: Never used  Substance Use Topics   Alcohol use: Yes    Alcohol/week: 1.0 - 2.0 standard drink    Types: 1 - 2 Glasses of wine per week   Drug use: No     Colonoscopy: 2005?  PAP: 2012  Bone density: [facility]/12/[facility]l  Lipid panel:  Allergies  Allergen Reactions   Codeine Other (See Comments)    headaches    Current Outpatient Medications  Medication Sig Dispense Refill   levothyroxine (SYNTHROID) 75 MCG tablet TAKE 1 TABLET BY MOUTH EVERY DAY 90 tablet 1   tamoxifen (NOLVADEX) 20 MG tablet TAKE 1 TABLET BY MOUTH EVERY DAY 90 tablet 2   No current facility-administered medications for this visit.    OBJECTIVE: White woman i who appears well Vitals:   09/15/21 1107  BP: 130/80  Pulse: (!) 59  Temp: 97.6 F (36.4 C)  SpO2: 100%      Body mass index is 29.65 kg/m.    ECOG FS: 1  Sclerae  unicteric, EOMs intact Wearing a mask No cervical or supraclavicular adenopathy Lungs no rales or rhonchi Heart regular rate and rhythm Abd soft, nontender, positive bowel sounds MSK no focal spinal tenderness, no upper extremity lymphedema Neuro: nonfocal, well oriented, appropriate affect Breasts: The right breast is benign.  The left breast has undergone lumpectomy and radiation.  There is no evidence of local recurrence.  Both axillae are benign.  LAB RESULTS: Lab Results  Component Value Date   WBC 4.1 09/15/2021   NEUTROABS 2.1 09/15/2021   HGB 11.8 (L) 09/15/2021   HCT 35.8 (L) 09/15/2021   MCV 81.7 09/15/2021   PLT 213 09/15/2021      Chemistry      Component Value Date/Time   NA 141 09/15/2021 0856   NA 140 06/09/2017 1217      Component Value Date/Time   CALCIUM 8.8 (L) 09/15/2021 8115  CALCIUM 9.6 06/09/2017 1217       No results found for: LABCA2  No components found for: MPNTI144  No results for input(s): INR in the last 168 hours.   Urinalysis No results found for: COLORURINE   STUDIES: No results found.   ASSESSMENT: 70 y.o. BRCA-2 positive [place] woman status post left lumpectomy with no axillary lymph node sampling 06/17/2012 for a pT1c pNX, clinical stage IA invasive ductal carcinoma, grade 2, 100% estrogen and 100% progesterone receptor positive, HER-2 negative, with an MIB-1 of 16%.  (1) left sentinel lymph node biopsy 07/25/2012 showed 0 of 3 lymph nodes positive  (2) Oncotype DX shows a recurrence score of 21, predicting a distant recurrence risk within 10 years of 14% if the patient's only systemic treatment is tamoxifen for 5 years  (3) adjuvant radiation therapy completed 10/03/2012  (4) tamoxifen started January 2014; the plan is to continue to January 2024  (a) bone density 07/21/2019 shows a T score of -1.6.  (5) BRCA-2 positive  (a) s/p TAH-BSO 05/01/2013  (b) intensified screening: Breast MRI April and breast  mammography October yearly   PLAN: Amber Santos is now a little over 9 years out from definitive surgery for her breast cancer with no evidence of disease recurrence.  This is very favorable.  She continues on tamoxifen, the plan being for a total of 10 years on that medication.  We discussed going off tamoxifen at this time.  She has likely obtained most of the benefit from it and I continuing may only reduce risk a couple of percentage points.  After much discussion however she decided she would prefer to continue at least until she meets her new oncologist (we have referred her to [facility] in [place]) and gets a second opinion from her.  Accordingly I have not made a return appointment for her here but we will be glad to see her again at any point in the future if and when the need arises.   Amber Santos, Virgie Dad, MD  09/15/21 11:24 AM Medical Oncology and Hematology [facility] [place], [place] [address] Tel. [phone]    Fax. [phone]   I, Wilburn Santos, am acting as scribe for Dr. Virgie Dad. Amber Santos.  I, Lurline Del MD, have reviewed the above documentation for accuracy and completeness, and I agree with the above.   *Total Encounter Time as defined by the Centers for Medicare and Medicaid Services includes, in addition to the face-to-face time of a patient visit (documented in the note above) non-face-to-face time: obtaining and reviewing outside history, ordering and reviewing medications, tests or procedures, care coordination (communications with other health care professionals or caregivers) and documentation in the medical record.

## 2021-09-15 ENCOUNTER — Other Ambulatory Visit: Payer: Self-pay | Admitting: *Deleted

## 2021-09-15 ENCOUNTER — Telehealth: Payer: Self-pay | Admitting: *Deleted

## 2021-09-15 ENCOUNTER — Inpatient Hospital Stay: Payer: Medicare PPO | Attending: Oncology

## 2021-09-15 ENCOUNTER — Other Ambulatory Visit: Payer: Medicare PPO

## 2021-09-15 ENCOUNTER — Other Ambulatory Visit: Payer: Self-pay

## 2021-09-15 ENCOUNTER — Inpatient Hospital Stay: Payer: Medicare PPO | Admitting: Oncology

## 2021-09-15 ENCOUNTER — Ambulatory Visit: Payer: Medicare PPO | Admitting: Oncology

## 2021-09-15 VITALS — BP 130/80 | HR 59 | Temp 97.6°F | Wt 189.3 lb

## 2021-09-15 DIAGNOSIS — Z79810 Long term (current) use of selective estrogen receptor modulators (SERMs): Secondary | ICD-10-CM | POA: Insufficient documentation

## 2021-09-15 DIAGNOSIS — C50412 Malignant neoplasm of upper-outer quadrant of left female breast: Secondary | ICD-10-CM

## 2021-09-15 DIAGNOSIS — Z923 Personal history of irradiation: Secondary | ICD-10-CM | POA: Insufficient documentation

## 2021-09-15 DIAGNOSIS — Z170 Estrogen receptor positive status [ER+]: Secondary | ICD-10-CM

## 2021-09-15 LAB — CBC WITH DIFFERENTIAL (CANCER CENTER ONLY)
Abs Immature Granulocytes: 0.01 10*3/uL (ref 0.00–0.07)
Basophils Absolute: 0 10*3/uL (ref 0.0–0.1)
Basophils Relative: 1 %
Eosinophils Absolute: 0.1 10*3/uL (ref 0.0–0.5)
Eosinophils Relative: 3 %
HCT: 35.8 % — ABNORMAL LOW (ref 36.0–46.0)
Hemoglobin: 11.8 g/dL — ABNORMAL LOW (ref 12.0–15.0)
Immature Granulocytes: 0 %
Lymphocytes Relative: 33 %
Lymphs Abs: 1.4 10*3/uL (ref 0.7–4.0)
MCH: 26.9 pg (ref 26.0–34.0)
MCHC: 33 g/dL (ref 30.0–36.0)
MCV: 81.7 fL (ref 80.0–100.0)
Monocytes Absolute: 0.5 10*3/uL (ref 0.1–1.0)
Monocytes Relative: 12 %
Neutro Abs: 2.1 10*3/uL (ref 1.7–7.7)
Neutrophils Relative %: 51 %
Platelet Count: 213 10*3/uL (ref 150–400)
RBC: 4.38 MIL/uL (ref 3.87–5.11)
RDW: 13.2 % (ref 11.5–15.5)
WBC Count: 4.1 10*3/uL (ref 4.0–10.5)
nRBC: 0 % (ref 0.0–0.2)

## 2021-09-15 LAB — CMP (CANCER CENTER ONLY)
ALT: 20 U/L (ref 0–44)
AST: 27 U/L (ref 15–41)
Albumin: 3.7 g/dL (ref 3.5–5.0)
Alkaline Phosphatase: 43 U/L (ref 38–126)
Anion gap: 10 (ref 5–15)
BUN: 17 mg/dL (ref 8–23)
CO2: 21 mmol/L — ABNORMAL LOW (ref 22–32)
Calcium: 8.8 mg/dL — ABNORMAL LOW (ref 8.9–10.3)
Chloride: 110 mmol/L (ref 98–111)
Creatinine: 0.78 mg/dL (ref 0.44–1.00)
GFR, Estimated: >60 mL/min (ref >60)
Glucose, Bld: 101 mg/dL — ABNORMAL HIGH (ref 70–99)
Potassium: 4.1 mmol/L (ref 3.5–5.1)
Sodium: 141 mmol/L (ref 135–145)
Total Bilirubin: 0.4 mg/dL (ref 0.3–1.2)
Total Protein: 6.8 g/dL (ref 6.5–8.1)

## 2021-09-15 MED ORDER — TAMOXIFEN CITRATE 20 MG PO TABS: 20.0000 mg | ORAL_TABLET | Freq: Every day | ORAL | 4 refills | Status: AC

## 2021-09-15 NOTE — Telephone Encounter (Signed)
Referral called to [facility] Dr Arlyss Repress. LM for office to call us back.

## 2021-12-17 ENCOUNTER — Telehealth: Payer: Self-pay

## 2021-12-17 NOTE — Telephone Encounter (Signed)
[place] called [place] and she is unable to continue seeing you. She did want to thank you for all you have done and she says she hopes to find a endocrinologist half as amazing as you are. She hope she can check in periodically with you if she has any questions or concerns. ?

## 2021-12-22 ENCOUNTER — Ambulatory Visit: Payer: Medicare PPO | Admitting: Internal Medicine
